# Patient Record
Sex: Female | Born: 1970 | Race: Black or African American | Hispanic: No | Marital: Single | Smoking: Never smoker
Health system: Southern US, Community
[De-identification: ages and names within clinical notes are randomized; demographics above are authoritative.]

## PROBLEM LIST (undated history)

## (undated) DIAGNOSIS — D649 Anemia, unspecified: Secondary | ICD-10-CM

## (undated) DIAGNOSIS — Z889 Allergy status to unspecified drugs, medicaments and biological substances status: Secondary | ICD-10-CM

## (undated) DIAGNOSIS — K859 Acute pancreatitis without necrosis or infection, unspecified: Secondary | ICD-10-CM

## (undated) DIAGNOSIS — E119 Type 2 diabetes mellitus without complications: Secondary | ICD-10-CM

## (undated) HISTORY — PX: EYE SURGERY: SHX253

---

## 2012-09-28 ENCOUNTER — Encounter (HOSPITAL_COMMUNITY): Payer: Self-pay | Admitting: Emergency Medicine

## 2012-09-28 ENCOUNTER — Inpatient Hospital Stay (HOSPITAL_COMMUNITY)
Admission: EM | Admit: 2012-09-28 | Discharge: 2012-10-03 | DRG: 204 | Disposition: A | Discharge status: Home / Self Care | Payer: BC Managed Care – PPO | Attending: Internal Medicine | Admitting: Internal Medicine

## 2012-09-28 DIAGNOSIS — J302 Other seasonal allergic rhinitis: Secondary | ICD-10-CM | POA: Diagnosis present

## 2012-09-28 DIAGNOSIS — Z79899 Other long term (current) drug therapy: Secondary | ICD-10-CM

## 2012-09-28 DIAGNOSIS — J309 Allergic rhinitis, unspecified: Secondary | ICD-10-CM | POA: Diagnosis present

## 2012-09-28 DIAGNOSIS — IMO0001 Reserved for inherently not codable concepts without codable children: Secondary | ICD-10-CM | POA: Diagnosis present

## 2012-09-28 DIAGNOSIS — K5900 Constipation, unspecified: Secondary | ICD-10-CM | POA: Diagnosis present

## 2012-09-28 DIAGNOSIS — Z6830 Body mass index (BMI) 30.0-30.9, adult: Secondary | ICD-10-CM

## 2012-09-28 DIAGNOSIS — E871 Hypo-osmolality and hyponatremia: Secondary | ICD-10-CM | POA: Diagnosis present

## 2012-09-28 DIAGNOSIS — IMO0002 Reserved for concepts with insufficient information to code with codable children: Secondary | ICD-10-CM | POA: Diagnosis present

## 2012-09-28 DIAGNOSIS — E669 Obesity, unspecified: Secondary | ICD-10-CM | POA: Diagnosis present

## 2012-09-28 DIAGNOSIS — R1011 Right upper quadrant pain: Secondary | ICD-10-CM

## 2012-09-28 DIAGNOSIS — K859 Acute pancreatitis without necrosis or infection, unspecified: Principal | ICD-10-CM | POA: Diagnosis present

## 2012-09-28 HISTORY — DX: Acute pancreatitis without necrosis or infection, unspecified: K85.90

## 2012-09-28 HISTORY — DX: Type 2 diabetes mellitus without complications: E11.9

## 2012-09-28 NOTE — ED Notes (Signed)
C/o abdominal pain. Was seen on Saturday in [place]'s ED and dx with acute pancreatitis. C/o same pain as before. Does have appointment with endocrinologist but states pain was too much to bear tonight.

## 2012-09-29 ENCOUNTER — Encounter (HOSPITAL_COMMUNITY): Payer: Self-pay

## 2012-09-29 ENCOUNTER — Inpatient Hospital Stay (HOSPITAL_COMMUNITY): Payer: BC Managed Care – PPO

## 2012-09-29 DIAGNOSIS — E669 Obesity, unspecified: Secondary | ICD-10-CM | POA: Diagnosis present

## 2012-09-29 DIAGNOSIS — K859 Acute pancreatitis without necrosis or infection, unspecified: Secondary | ICD-10-CM

## 2012-09-29 DIAGNOSIS — E1165 Type 2 diabetes mellitus with hyperglycemia: Secondary | ICD-10-CM | POA: Diagnosis present

## 2012-09-29 LAB — CBC
MCH: 28.3 pg (ref 26.0–34.0)
MCV: 83.4 fL (ref 78.0–100.0)
Platelets: 207 10*3/uL (ref 150–400)
RDW: 12.5 % (ref 11.5–15.5)

## 2012-09-29 LAB — TSH: TSH: 1.349 u[IU]/mL (ref 0.350–4.500)

## 2012-09-29 LAB — URINALYSIS, ROUTINE W REFLEX MICROSCOPIC
Ketones, ur: 15 mg/dL — AB
Leukocytes, UA: NEGATIVE
Nitrite: NEGATIVE
Urobilinogen, UA: 0.2 mg/dL (ref 0.0–1.0)
pH: 5.5 (ref 5.0–8.0)

## 2012-09-29 LAB — GLUCOSE, CAPILLARY
Glucose-Capillary: 164 mg/dL — ABNORMAL HIGH (ref 70–99)
Glucose-Capillary: 119 mg/dL — ABNORMAL HIGH (ref 70–99)

## 2012-09-29 LAB — CBC WITH DIFFERENTIAL/PLATELET
Eosinophils Absolute: 0 10*3/uL (ref 0.0–0.7)
Eosinophils Relative: 0 % (ref 0–5)
Hemoglobin: 12 g/dL (ref 12.0–15.0)
Lymphocytes Relative: 6 % — ABNORMAL LOW (ref 12–46)
Lymphs Abs: 0.9 10*3/uL (ref 0.7–4.0)
MCH: 28.6 pg (ref 26.0–34.0)
MCV: 83.6 fL (ref 78.0–100.0)
Monocytes Relative: 5 % (ref 3–12)
RBC: 4.2 MIL/uL (ref 3.87–5.11)

## 2012-09-29 LAB — LIPASE, BLOOD: Lipase: 597 U/L — ABNORMAL HIGH (ref 11–59)

## 2012-09-29 LAB — COMPREHENSIVE METABOLIC PANEL
Alkaline Phosphatase: 78 U/L (ref 39–117)
BUN: 7 mg/dL (ref 6–23)
CO2: 24 mEq/L (ref 19–32)
Calcium: 9.9 mg/dL (ref 8.4–10.5)
GFR calc Af Amer: >90 mL/min (ref >90)
GFR calc non Af Amer: >90 mL/min (ref >90)
Glucose, Bld: 250 mg/dL — ABNORMAL HIGH (ref 70–99)
Potassium: 4 mEq/L (ref 3.5–5.1)
Total Protein: 8.6 g/dL — ABNORMAL HIGH (ref 6.0–8.3)

## 2012-09-29 LAB — HEPATIC FUNCTION PANEL
AST: 6 U/L (ref 0–37)
Bilirubin, Direct: <0.1 mg/dL (ref 0.0–0.3)

## 2012-09-29 LAB — URINE MICROSCOPIC-ADD ON

## 2012-09-29 MED ORDER — SODIUM CHLORIDE 0.9 % IV SOLN: INTRAVENOUS | Status: DC

## 2012-09-29 MED ORDER — SODIUM CHLORIDE 0.9 % IV SOLN
Freq: Once | INTRAVENOUS | Status: AC
  Administered 2012-09-29: 02:00:00 via INTRAVENOUS

## 2012-09-29 MED ORDER — DIPHENHYDRAMINE HCL 50 MG/ML IJ SOLN
12.5000 mg | Freq: Four times a day (QID) | INTRAMUSCULAR | Status: DC | PRN
  Filled 2012-09-29: qty 1

## 2012-09-29 MED ORDER — TRAZODONE HCL 50 MG PO TABS: 25.0000 mg | ORAL_TABLET | Freq: Every evening | ORAL | Status: DC | PRN

## 2012-09-29 MED ORDER — ONDANSETRON HCL 4 MG/2ML IJ SOLN: 4.0000 mg | INTRAMUSCULAR | Status: DC | PRN

## 2012-09-29 MED ORDER — POTASSIUM CHLORIDE IN NACL 20-0.9 MEQ/L-% IV SOLN
INTRAVENOUS | Status: DC
  Administered 2012-09-29: 06:00:00 via INTRAVENOUS
  Filled 2012-09-29: qty 1000

## 2012-09-29 MED ORDER — DIPHENHYDRAMINE HCL 50 MG/ML IJ SOLN
25.0000 mg | Freq: Four times a day (QID) | INTRAMUSCULAR | Status: DC | PRN
  Administered 2012-09-29: 25 mg via INTRAVENOUS

## 2012-09-29 MED ORDER — BISACODYL 10 MG RE SUPP: 10.0000 mg | Freq: Every day | RECTAL | Status: DC | PRN

## 2012-09-29 MED ORDER — OXYCODONE HCL 5 MG PO TABS
5.0000 mg | ORAL_TABLET | ORAL | Status: DC | PRN
  Administered 2012-10-02: 5 mg via ORAL
  Filled 2012-09-29: qty 1

## 2012-09-29 MED ORDER — ENOXAPARIN SODIUM 40 MG/0.4ML ~~LOC~~ SOLN
40.0000 mg | SUBCUTANEOUS | Status: DC
  Administered 2012-09-29 – 2012-10-03 (×5): 40 mg via SUBCUTANEOUS
  Filled 2012-09-29 (×5): qty 0.4

## 2012-09-29 MED ORDER — SODIUM CHLORIDE 0.9 % IV BOLUS (SEPSIS)
1000.0000 mL | Freq: Once | INTRAVENOUS | Status: AC
  Administered 2012-09-29: 1000 mL via INTRAVENOUS

## 2012-09-29 MED ORDER — HYDROMORPHONE HCL PF 1 MG/ML IJ SOLN
0.5000 mg | INTRAMUSCULAR | Status: DC | PRN
  Administered 2012-09-29 – 2012-10-03 (×19): 1 mg via INTRAVENOUS
  Filled 2012-09-29 (×20): qty 1

## 2012-09-29 MED ORDER — PANTOPRAZOLE SODIUM 40 MG IV SOLR
40.0000 mg | INTRAVENOUS | Status: AC
  Administered 2012-09-29: 40 mg via INTRAVENOUS
  Filled 2012-09-29: qty 40

## 2012-09-29 MED ORDER — ONDANSETRON HCL 4 MG/2ML IJ SOLN
4.0000 mg | Freq: Once | INTRAMUSCULAR | Status: AC
  Administered 2012-09-29: 4 mg via INTRAVENOUS
  Filled 2012-09-29: qty 2

## 2012-09-29 MED ORDER — FLEET ENEMA 7-19 GM/118ML RE ENEM: 1.0000 | ENEMA | Freq: Once | RECTAL | Status: AC | PRN

## 2012-09-29 MED ORDER — POTASSIUM CHLORIDE IN NACL 20-0.9 MEQ/L-% IV SOLN
INTRAVENOUS | Status: DC
  Administered 2012-09-29 – 2012-10-02 (×7): via INTRAVENOUS

## 2012-09-29 MED ORDER — INSULIN ASPART 100 UNIT/ML ~~LOC~~ SOLN
0.0000 [IU] | SUBCUTANEOUS | Status: DC
  Administered 2012-09-29: 3 [IU] via SUBCUTANEOUS
  Administered 2012-09-29 – 2012-09-30 (×5): 2 [IU] via SUBCUTANEOUS

## 2012-09-29 MED ORDER — HYDROMORPHONE HCL PF 1 MG/ML IJ SOLN
1.0000 mg | Freq: Once | INTRAMUSCULAR | Status: AC
  Administered 2012-09-29: 1 mg via INTRAVENOUS
  Filled 2012-09-29: qty 1

## 2012-09-29 NOTE — ED Provider Notes (Signed)
History     CSN: [id number]  Arrival date & time 09/28/12  2235   First MD Initiated Contact with Patient 09/28/12 2304      Chief Complaint  Patient presents with  . Abdominal Pain    (Consider location/radiation/quality/duration/timing/severity/associated sxs/prior treatment) HPI Tara Kane is a 42 y.o. female , diabetic who presents to the Emergency Department complaining of abdominal pain that radiates to her back. She was seen at the the [facility] ER on Saturday and told she had a pancreatitis with an elevated lipase. She was discharged with bentyl which has not helped her pain. Today her pain was so severe she felt she could not handle it. She states she had an ultrasound which did not show an inflamed gall bladder. She has an appointment with an endocrinologist.     Past Medical History  Diagnosis Date  . Diabetes mellitus without complication     History reviewed. No pertinent past surgical history.  No family history on file.  History  Substance Use Topics  . Smoking status: Never Smoker   . Smokeless tobacco: Not on file  . Alcohol Use: No    OB History   Grav Para Term Preterm Abortions TAB SAB Ect Mult Living                  Review of Systems  Constitutional: Negative for fever.       10 Systems reviewed and are negative for acute change except as noted in the HPI.  HENT: Negative for congestion.   Eyes: Negative for discharge and redness.  Respiratory: Negative for cough and shortness of breath.   Cardiovascular: Negative for chest pain.  Gastrointestinal: Positive for nausea and abdominal pain. Negative for vomiting.  Musculoskeletal: Negative for back pain.  Skin: Negative for rash.  Neurological: Negative for syncope, numbness and headaches.  Psychiatric/Behavioral:       No behavior change.    Allergies  Aspirin and Ibuprofen  Home Medications   Current Outpatient Rx  Name  Route  Sig  Dispense  Refill  . fexofenadine (ALLEGRA)  180 MG tablet   Oral   Take 180 mg by mouth daily.         . metFORMIN (GLUCOPHAGE) 1000 MG tablet   Oral   Take 1,000 mg by mouth 2 (two) times daily with a meal.         . Multiple Vitamin (MULTIVITAMIN) tablet   Oral   Take 1 tablet by mouth daily.         . ramipril (ALTACE) 2.5 MG tablet   Oral   Take 2.5 mg by mouth daily.           BP 116/50  Pulse 86  Temp(Src) 98.1 F (36.7 C) (Oral)  Resp 20  Ht 5\' 10"  (1.778 m)  Wt 210 lb (95.255 kg)  BMI 30.13 kg/m2  SpO2 94%  LMP 09/27/2012  Physical Exam  Nursing note and vitals reviewed. Constitutional: She appears well-developed and well-nourished.  Awake, alert, nontoxic appearance.  HENT:  Head: Normocephalic and atraumatic.  Right Ear: External ear normal.  Left Ear: External ear normal.  Mouth/Throat: Oropharynx is clear and moist.  Eyes: EOM are normal. Pupils are equal, round, and reactive to light. Right eye exhibits no discharge. Left eye exhibits no discharge.  Neck: Normal range of motion. Neck supple.  Cardiovascular: Normal rate and intact distal pulses.   Pulmonary/Chest: Effort normal and breath sounds normal. She exhibits no tenderness.  Abdominal: Soft. Bowel sounds are normal. There is tenderness. There is no rebound.  RUQ tenderness to palpation. No epigastric tenderness.   Musculoskeletal: She exhibits no tenderness.  Baseline ROM, no obvious new focal weakness.  Neurological:  Mental status and motor strength appears baseline for patient and situation.  Skin: No rash noted.  Psychiatric: She has a normal mood and affect.    ED Course  Procedures (including critical care time)  Results for orders placed during the hospital encounter of 09/28/12  CBC WITH DIFFERENTIAL      Result Value Range   WBC 14.8 (*) 4.0 - 10.5 K/uL   RBC 4.20  3.87 - 5.11 MIL/uL   Hemoglobin 12.0  12.0 - 15.0 g/dL   HCT 21.3 (*) 08.6 - 57.8 %   MCV 83.6  78.0 - 100.0 fL   MCH 28.6  26.0 - 34.0 pg   MCHC  34.2  30.0 - 36.0 g/dL   RDW 46.9  62.9 - 52.8 %   Platelets 241  150 - 400 K/uL   Neutrophils Relative 89 (*) 43 - 77 %   Neutro Abs 13.1 (*) 1.7 - 7.7 K/uL   Lymphocytes Relative 6 (*) 12 - 46 %   Lymphs Abs 0.9  0.7 - 4.0 K/uL   Monocytes Relative 5  3 - 12 %   Monocytes Absolute 0.8  0.1 - 1.0 K/uL   Eosinophils Relative 0  0 - 5 %   Eosinophils Absolute 0.0  0.0 - 0.7 K/uL   Basophils Relative 0  0 - 1 %   Basophils Absolute 0.0  0.0 - 0.1 K/uL  COMPREHENSIVE METABOLIC PANEL      Result Value Range   Sodium 131 (*) 135 - 145 mEq/L   Potassium 4.0  3.5 - 5.1 mEq/L   Chloride 93 (*) 96 - 112 mEq/L   CO2 24  19 - 32 mEq/L   Glucose, Bld 250 (*) 70 - 99 mg/dL   BUN 7  6 - 23 mg/dL   Creatinine, Ser 4.13 (*) 0.50 - 1.10 mg/dL   Calcium 9.9  8.4 - 24.4 mg/dL   Total Protein 8.6 (*) 6.0 - 8.3 g/dL   Albumin 3.7  3.5 - 5.2 g/dL   AST 10  0 - 37 U/L   ALT 7  0 - 35 U/L   Alkaline Phosphatase 78  39 - 117 U/L   Total Bilirubin 0.2 (*) 0.3 - 1.2 mg/dL   GFR calc non Af Amer >90  >90 mL/min   GFR calc Af Amer >90  >90 mL/min  LIPASE, BLOOD      Result Value Range   Lipase 597 (*) 11 - 59 U/L    3:31 AM:  T/C to Dr. Orvan Falconer, hospitalist, case discussed, including:  HPI, pertinent PM/SHx, VS/PE, dx testing, ED course and treatment.  Agreeable to admission.  Requests to write temporary orders, med-surg bed to team 1.   MDM  Patient with recent visit to [facility] ER and diagnosis of pancreatitis. Her pain is RUQ rather than epigastric pain with radiation to the back. Given IVF, analgesic, PPI, zofran with some relief. Lipase is 597. WBC 14.8.  Will arrange for admission.Spoke with Dr. Orvan Falconer who will admit. Pt stable in ED with no significant deterioration in condition.The patient appears reasonably stabilized for admission considering the current resources, flow, and capabilities available in the ED at this time, and I doubt any other [facility] requiring further screening and/or treatment  in the  ED prior to admission.  MDM Reviewed: nursing note and vitals Interpretation: labs  CRITICAL CARE Performed by: Annamarie Dawley Total critical care time: 40 Critical care time was exclusive of separately billable procedures and treating other patients. Critical care was necessary to treat or prevent imminent or life-threatening deterioration. Critical care was time spent personally by me on the following activities: development of treatment plan with patient and/or surrogate as well as nursing, discussions with consultants, evaluation of patient's response to treatment, examination of patient, obtaining history from patient or surrogate, ordering and performing treatments and interventions, ordering and review of laboratory studies, ordering and review of radiographic studies, pulse oximetry and re-evaluation of patient's condition.         Nicoletta Dress. Colon Branch, MD 09/29/12 [phone]

## 2012-09-29 NOTE — H&P (Signed)
[facility] History and Physical  Tara Kane  ZOX:[id number]  DOB: Feb 03, 1971   DOA: 09/29/2012   PCP:   No primary provider on file. Endocrinologist: Dr. Talmage Nap   Chief Complaint:  Abdominal pain for 3 days  HPI: Tara Kane is an 42 y.o. female.  Obese African American lady who has progressively lost about 50 pounds on Weight Watchers diet, was seen at the [facility] emergency room 3 days ago for abdominal pain and diagnosed with acute pancreatitis. She reportedly had an ultrasound which was "normal"and was discharged home with Bentyl to follow up with her primary care physician. However because the pain is gotten progressively worse she presented to our emergency room tonight. She complains of pain which is epigastric and in the right anterior flank radiating around to the posterior flank. She has difficulty finding a comfortable position because of the pain. Since Saturday she has barely eaten or she gets nauseous at the sight of food.  She denies vomiting or diarrhea; she denies fever but  has been having chills  Rewiew of Systems:   All systems negative except as marked bold or noted in the HPI;  Constitutional:    malaise, fever Eyes:   eye pain, redness and discharge. ;  ENMT:   ear pain, hoarseness, nasal congestion, sinus pressure and sore throat. ;  Cardiovascular:    chest pain, palpitations, diaphoresis, dyspnea and peripheral edema.  Respiratory:   cough, hemoptysis, wheezing and stridor. ;  Gastrointestinal:  , vomiting, diarrhea, constipation, abdominal pain, melena, blood in stool, hematemesis, jaundice and rectal bleeding. unusual weight loss..   Genitourinary:    frequency, dysuria, incontinence,flank pain and hematuria; Musculoskeletal:    neck pain.  swelling and trauma.;  Skin: .  pruritus, rash, abrasions, bruising and skin lesion.; ulcerations Neuro:    headache, lightheadedness and neck stiffness.  weakness, altered level of consciousness, altered  mental status, extremity weakness, burning feet, involuntary movement, seizure and syncope.  Psych:    anxiety, depression, insomnia, tearfulness, panic attacks, hallucinations, paranoia, suicidal or homicidal ideation    Past Medical History  Diagnosis Date  . Diabetes mellitus without complication     History reviewed. No pertinent past surgical history.  Medications:  HOME MEDS: Prior to Admission medications   Medication Sig Start Date End Date Taking? Authorizing Provider  fexofenadine (ALLEGRA) 180 MG tablet Take 180 mg by mouth daily.   Yes Historical Provider, MD  metFORMIN (GLUCOPHAGE) 1000 MG tablet Take 1,000 mg by mouth 2 (two) times daily with a meal.   Yes Historical Provider, MD  Multiple Vitamin (MULTIVITAMIN) tablet Take 1 tablet by mouth daily.   Yes Historical Provider, MD  ramipril (ALTACE) 2.5 MG tablet Take 2.5 mg by mouth daily.   Yes Historical Provider, MD     Allergies:  Allergies  Allergen Reactions  . Aspirin   . Ibuprofen     Social History:   reports that she has never smoked. She does not have any smokeless tobacco history on file. She reports that she does not drink alcohol or use illicit drugs.  Family History: Family History  Problem Relation Age of Onset  . Diabetes Father      Physical Exam: Filed Vitals:   09/28/12 2239 09/29/12 0147 09/29/12 0403 09/29/12 0435  BP: 126/46 116/50 116/55 124/70  Pulse: 69 86 78 71  Temp: 98.1 F (36.7 C)   98.1 F (36.7 C)  TempSrc: Oral     Resp: 18 20 16  18  Height: 5\' 10"  (1.778 m)     Weight: 95.255 kg (210 lb)   96.299 kg (212 lb 4.8 oz)  SpO2: 99% 94% 97% 100%   Blood pressure 124/70, pulse 71, temperature 98.1 F (36.7 C), temperature source Oral, resp. rate 18, height 5\' 10"  (1.778 m), weight 96.299 kg (212 lb 4.8 oz), last menstrual period 09/27/2012, SpO2 100.00%.  GEN:  PleasantAmerican lady lying  bed looking uncomfortable ; cooperative with exam PSYCH:  alert and oriented x4;    neither anxious or depressed; affect is appropriate. HEENT: Mucous membranes pink, dry  and anicteric; PERRLA; EOM intact; no cervical lymphadenopathy nor thyromegaly or carotid bruit; no JVD; thick neck Breasts:: Not examined CHEST WALL: No tenderness CHEST: Normal respiration, clear to auscultation bilaterally HEART: Regular rate and rhythm; no murmurs rubs or gallops BACK: No kyphosis no scoliosis; no CVA tenderness ABDOMEN: Obese,  Diffuse tenderness maximal in the right anterior ; no masses, no organomegaly, normal abdominal bowel sounds; no intertriginous candida. Rectal Exam: Not done EXTREMITIES: No bone or joint deformity;  no edema; no ulcerations. Genitalia: not examined PULSES: 2+ and symmetric SKIN: Normal hydration no rash or ulceration CNS: Cranial nerves 2-12 grossly intact no focal lateralizing neurologic deficit   Labs on Admission:  Basic Metabolic Panel:  Recent Labs Lab 09/29/12 0125  NA 131*  K 4.0  CL 93*  CO2 24  GLUCOSE 250*  BUN 7  CREATININE 0.44*  CALCIUM 9.9   Liver Function Tests:  Recent Labs Lab 09/29/12 0125  AST 10  ALT 7  ALKPHOS 78  BILITOT 0.2*  PROT 8.6*  ALBUMIN 3.7    Recent Labs Lab 09/29/12 0125  LIPASE 597*   No results found for this basename: AMMONIA,  in the last 168 hours CBC:  Recent Labs Lab 09/29/12 0125  WBC 14.8*  NEUTROABS 13.1*  HGB 12.0  HCT 35.1*  MCV 83.6  PLT 241   Cardiac Enzymes: No results found for this basename: CKTOTAL, CKMB, CKMBINDEX, TROPONINI,  in the last 168 hours BNP: No components found with this basename: POCBNP,  D-dimer: No components found with this basename: D-DIMER,  CBG: No results found for this basename: GLUCAP,  in the last 168 hours  Radiological Exams on Admission: No results found.    Assessment/Plan Present on Admission:  . Pancreatitis, acute . Diabetes type 2, uncontrolled . Obesity, unspecified   PLAN: We'll admit this lady for intravenous fluid  hydration and pain management. Will keep her n.p.o. except for ice chips to rest her pancreas Sliding scale around-the-clock coverage for her diabetes Get ultrasound to assess gallbladder and gallstones She is already on a program to deal with obesity and is making significant progress;  her weight loss may in fact be a precipitating cause of gallstones and pancreatitis; no further adjustments at this  Other plans as per orders.  Code Status: FULL CODE  Family Communication: The patient and her mother at bedside  Disposition Plan:  home when able to tolerate a diet without pain    CAMPBELL,LEOPOLD Nocturnist [facility] Pager [phone]   09/29/2012, 5:52 AM

## 2012-09-29 NOTE — Progress Notes (Signed)
UR Chart Review Completed  

## 2012-09-29 NOTE — Progress Notes (Signed)
This patient was admitted to the hospital earlier today by Dr. Orvan Falconer  Patient seen and examined.  Database reviewed.  Patient has been admitted for acute pancreatitis. She denies any alcohol intake. Ultrasound is negative for gallstones. Will check lipid panel for hypertriglyceridemia. She will likely need outpatient followup with GI to determine further etiology of pancreatitis and may need outpatient EUS. We will start the patient on clear liquids since she is hungry and her abdominal pain has improved.

## 2012-09-30 ENCOUNTER — Encounter (HOSPITAL_COMMUNITY): Payer: Self-pay | Admitting: Internal Medicine

## 2012-09-30 DIAGNOSIS — E871 Hypo-osmolality and hyponatremia: Secondary | ICD-10-CM | POA: Diagnosis present

## 2012-09-30 LAB — LIPID PANEL
HDL: 27 mg/dL — ABNORMAL LOW (ref >39)
LDL Cholesterol: 34 mg/dL (ref 0–99)
Total CHOL/HDL Ratio: 2.6 RATIO
Triglycerides: 48 mg/dL (ref <150)
VLDL: 10 mg/dL (ref 0–40)

## 2012-09-30 LAB — CBC
MCH: 29.2 pg (ref 26.0–34.0)
Platelets: 189 10*3/uL (ref 150–400)
RBC: 3.56 MIL/uL — ABNORMAL LOW (ref 3.87–5.11)
WBC: 9.4 10*3/uL (ref 4.0–10.5)

## 2012-09-30 LAB — COMPREHENSIVE METABOLIC PANEL
ALT: 6 U/L (ref 0–35)
AST: 5 U/L (ref 0–37)
Albumin: 2.7 g/dL — ABNORMAL LOW (ref 3.5–5.2)
CO2: 23 mEq/L (ref 19–32)
Calcium: 8.9 mg/dL (ref 8.4–10.5)
Chloride: 102 mEq/L (ref 96–112)
GFR calc non Af Amer: >90 mL/min (ref >90)
Sodium: 134 mEq/L — ABNORMAL LOW (ref 135–145)

## 2012-09-30 LAB — GLUCOSE, CAPILLARY
Glucose-Capillary: 161 mg/dL — ABNORMAL HIGH (ref 70–99)
Glucose-Capillary: 197 mg/dL — ABNORMAL HIGH (ref 70–99)

## 2012-09-30 MED ORDER — FEXOFENADINE HCL 180 MG PO TABS
180.0000 mg | ORAL_TABLET | Freq: Every day | ORAL | Status: DC
  Administered 2012-10-01 – 2012-10-03 (×3): 180 mg via ORAL
  Filled 2012-09-30 (×5): qty 1

## 2012-09-30 MED ORDER — INSULIN ASPART 100 UNIT/ML ~~LOC~~ SOLN
0.0000 [IU] | Freq: Every day | SUBCUTANEOUS | Status: DC
  Administered 2012-10-02: 3 [IU] via SUBCUTANEOUS

## 2012-09-30 MED ORDER — INSULIN ASPART 100 UNIT/ML ~~LOC~~ SOLN
0.0000 [IU] | Freq: Three times a day (TID) | SUBCUTANEOUS | Status: DC
  Administered 2012-09-30: 3 [IU] via SUBCUTANEOUS
  Administered 2012-10-01: 5 [IU] via SUBCUTANEOUS
  Administered 2012-10-01 (×2): 3 [IU] via SUBCUTANEOUS
  Administered 2012-10-02: 5 [IU] via SUBCUTANEOUS
  Administered 2012-10-02: 3 [IU] via SUBCUTANEOUS
  Administered 2012-10-02 – 2012-10-03 (×4): 5 [IU] via SUBCUTANEOUS

## 2012-09-30 MED ORDER — LORATADINE 10 MG PO TABS
10.0000 mg | ORAL_TABLET | Freq: Every day | ORAL | Status: DC
  Filled 2012-09-30: qty 1

## 2012-09-30 MED ORDER — NON FORMULARY: 1.0000 | Freq: Every day | Status: DC

## 2012-09-30 NOTE — Care Management Note (Unsigned)
    Page 1 of 1   10/01/2012     12:32:54 PM   CARE MANAGEMENT NOTE 10/01/2012  Patient:  Tara Kane, Tara Kane   Account Number:  [id number]  Date Initiated:  09/30/2012  Documentation initiated by:  Rosemary Holms  Subjective/Objective Assessment:   Pt admitted from home where she lives alone. Mother is a retired Engineer, civil (consulting) who she relies on for advise.  CM discussed need for PCP which pt agrees. Sees an endicrinologist in [place] and has gone to Urgent Care centers. CM called, at pt's     Action/Plan:   request, Drs Simpson/Otterbein and asked if they would accept her. Per MD office, they are currently not accepting new pts. CM left a list of providers pt can follow up with. Pt wants to talk with her mother before pursuing PCP.   Anticipated DC Date:  10/02/2012   Anticipated DC Plan:  HOME/SELF CARE      DC Planning Services  CM consult      Choice offered to / List presented to:             Status of service:  In process, will continue to follow Medicare Important Message given?   (If response is "NO", the following Medicare IM given date fields will be blank) Date Medicare IM given:   Date Additional Medicare IM given:    Discharge Disposition:    Per UR Regulation:    If discussed at Long Length of Stay Meetings, dates discussed:    Comments:  10/01/12 Rosemary Holms RN BSN CM Pt states she has set up an appt with Tara Mires MD for 10/13/12.  09/30/12 Rosemary Holms RN BSN CM

## 2012-09-30 NOTE — Progress Notes (Signed)
Subjective: Patient is sitting up in bed. She has no complaints of abdominal pain currently. She has been tolerating all of her clear liquids except for carbonated beverages which appears to cause some abdominal discomfort. No recent vomiting. No recent bowel movement.  Objective: Vital signs in last 24 hours: Filed Vitals:   09/29/12 2157 09/30/12 0500 09/30/12 0507 09/30/12 0956  BP: 136/70  119/71 126/69  Pulse: 99  88 92  Temp: 98.9 F (37.2 C)  97.8 F (36.6 C) 97.7 F (36.5 C)  TempSrc:    Oral  Resp: 20  18 18   Height:      Weight:  96.2 kg (212 lb 1.3 oz)    SpO2: 100%  98% 97%    Intake/Output Summary (Last 24 hours) at 09/30/12 1334 Last data filed at 09/30/12 0830  Gross per 24 hour  Intake   2430 ml  Output   1450 ml  Net    980 ml    Weight change: 0.945 kg (2 lb 1.3 oz)  Physical exam: General: Pleasant alert 42 year old African-American woman sitting up in bed, in no acute distress. Lungs: Clear to auscultation bilaterally. Heart: S1, S2, with no murmurs rubs or gallops. Abdomen: Positive bowel sounds, soft, mildly tender in the epigastrium and right upper quadrant. No rebound, no guarding, no hepatosplenomegaly. No distention. Extremities: No pedal edema.  Lab Results: Basic Metabolic Panel:  Recent Labs  16/10/96 0125 09/29/12 0605 09/30/12 0614  NA 131*  --  134*  K 4.0  --  3.9  CL 93*  --  102  CO2 24  --  23  GLUCOSE 250*  --  184*  BUN 7  --  5*  CREATININE 0.44* 0.40* 0.42*  CALCIUM 9.9  --  8.9  MG  --  1.7  --    Liver Function Tests:  Recent Labs  09/29/12 0605 09/30/12 0614  AST 6 5  ALT 7 6  ALKPHOS 61 60  BILITOT 0.2* 0.2*  PROT 7.0 6.7  ALBUMIN 2.9* 2.7*    Recent Labs  09/29/12 0125 09/30/12 0614  LIPASE 597* 157*   No results found for this basename: AMMONIA,  in the last 72 hours CBC:  Recent Labs  09/29/12 0125 09/29/12 0605 09/30/12 0614  WBC 14.8* 10.3 9.4  NEUTROABS 13.1*  --   --   HGB 12.0  10.4* 10.4*  HCT 35.1* 30.7* 29.7*  MCV 83.6 83.4 83.4  PLT 241 207 189   Cardiac Enzymes: No results found for this basename: CKTOTAL, CKMB, CKMBINDEX, TROPONINI,  in the last 72 hours BNP: No results found for this basename: PROBNP,  in the last 72 hours D-Dimer: No results found for this basename: DDIMER,  in the last 72 hours CBG:  Recent Labs  09/29/12 1221 09/29/12 1642 09/29/12 2156 09/29/12 2351 09/30/12 0750 09/30/12 1154  GLUCAP 140* 119* 197* 179* 161* 193*   Hemoglobin A1C:  Recent Labs  09/29/12 0605  HGBA1C 8.1*   Fasting Lipid Panel:  Recent Labs  09/30/12 0616  CHOL 71  HDL 27*  LDLCALC 34  TRIG 48  CHOLHDL 2.6   Thyroid Function Tests:  Recent Labs  09/29/12 0605  TSH 1.349   Anemia Panel: No results found for this basename: VITAMINB12, FOLATE, FERRITIN, TIBC, IRON, RETICCTPCT,  in the last 72 hours Coagulation: No results found for this basename: LABPROT, INR,  in the last 72 hours Urine Drug Screen: Drugs of Abuse  No results found for this basename:  labopia,  cocainscrnur,  labbenz,  amphetmu,  thcu,  labbarb    Alcohol Level: No results found for this basename: ETH,  in the last 72 hours Urinalysis:  Recent Labs  09/29/12 0827  COLORURINE YELLOW  LABSPEC >1.030*  PHURINE 5.5  GLUCOSEU 250*  HGBUR NEGATIVE  BILIRUBINUR NEGATIVE  KETONESUR 15*  PROTEINUR TRACE*  UROBILINOGEN 0.2  NITRITE NEGATIVE  LEUKOCYTESUR NEGATIVE   Misc. Labs:   Micro: No results found for this or any previous visit (from the past 240 hour(s)).  Studies/Results: US Abdomen Complete  09/29/2012  *RADIOLOGY REPORT*  Abdominal ultrasound  History:  Acute pancreatitis  Gallbladder is visualized in multiple projections.  There are no gallstones, gallbladder wall thickening, or pericholecystic fluid collection.  There is no intrahepatic, common hepatic, or common bile duct dilatation.  Pancreas is obscured by gas.  No focal liver lesions are  identified.  There may be mild fatty infiltration in the liver.  Flow in the portal vein is in the anatomic direction.  Spleen is normal in size and homogeneous echotexture.  Kidneys bilaterally appear normal.  There is trace ascites adjacent to the spleen.  Aorta is nonaneurysmal.  Inferior vena cava appears normal.  Conclusion:  Pancreas cannot be visualized due to overlying gas. Given this circumstance, the degree of potential acute pancreatitis cannot be assessed with ultrasound.  There may be mild fatty change in the liver.  No focal liver lesions are identified.  Trace ascites.  Study otherwise unremarkable.   Original Report Authenticated By: Bretta Bang, M.D.     Medications:  Scheduled: . enoxaparin (LOVENOX) injection  40 mg Subcutaneous Q24H  . [START ON 10/01/2012] fexofenadine  180 mg Oral Daily  . insulin aspart  0-15 Units Subcutaneous TID WC  . insulin aspart  0-5 Units Subcutaneous QHS   Continuous: . 0.9 % NaCl with KCl 20 mEq / L 150 mL/hr at 09/30/12 4782   NFA:OZHYQMVHQ, diphenhydrAMINE, diphenhydrAMINE, HYDROmorphone (DILAUDID) injection, ondansetron (ZOFRAN) IV, oxyCODONE, traZODone  Assessment: Principal Problem:   Pancreatitis, acute Active Problems:   Obesity, unspecified   Diabetes type 2, uncontrolled   Seasonal allergies   Hyponatremia   1. Acute pancreatitis. Etiology uncertain. Ultrasound of the abdomen reveals no gallstones. She does not drink alcohol. The pancreatitis could be idiopathic or possibly secondary to ramipril since pancreatitis is listed as one of the potential side effects. Her lipase is decreasing and she is improving clinically, but has not completely pain-free. Next  Hyponatremia. Resolving with IV fluid hydration.  Type 2 diabetes mellitus. Metformin is on hold. She is being treated with sliding scale NovoLog.  Seasonal allergies. Allegra has been restarted per request.  Dilutional anemia. We'll continue to monitor.  Plan:  1.  We'll decrease the IV fluids some. We'll advance diet to full liquids. 2. Will add each bedtime sliding scale NovoLog to NovoLog during the day. 3. We'll continue to hold ramipril. 4. Order lipase level in the morning.    LOS: 2 days   FISHER,DENISE 09/30/2012, 1:34 PM  She

## 2012-10-01 DIAGNOSIS — K5900 Constipation, unspecified: Secondary | ICD-10-CM | POA: Diagnosis present

## 2012-10-01 LAB — GLUCOSE, CAPILLARY
Glucose-Capillary: 161 mg/dL — ABNORMAL HIGH (ref 70–99)
Glucose-Capillary: 189 mg/dL — ABNORMAL HIGH (ref 70–99)

## 2012-10-01 LAB — BASIC METABOLIC PANEL
GFR calc Af Amer: >90 mL/min (ref >90)
GFR calc non Af Amer: >90 mL/min (ref >90)
Potassium: 3.7 mEq/L (ref 3.5–5.1)
Sodium: 135 mEq/L (ref 135–145)

## 2012-10-01 LAB — LIPASE, BLOOD: Lipase: 169 U/L — ABNORMAL HIGH (ref 11–59)

## 2012-10-01 MED ORDER — BISACODYL 10 MG RE SUPP
10.0000 mg | Freq: Once | RECTAL | Status: AC
  Administered 2012-10-01: 10 mg via RECTAL
  Filled 2012-10-01: qty 1

## 2012-10-01 NOTE — Progress Notes (Signed)
UR Chart Review Completed  

## 2012-10-01 NOTE — Progress Notes (Signed)
Subjective: Patient is sitting up in bed. She complains of mostly lower abdominal pain and says that she has not had a bowel movement in more than one week. Epigastric pain is still present, but overall less. No complaints of nausea vomiting.  Objective: Vital signs in last 24 hours: Filed Vitals:   09/30/12 1754 09/30/12 2100 10/01/12 0200 10/01/12 0500  BP: 132/84 110/73 132/84 138/84  Pulse: 81 84 75 79  Temp: 97.6 F (36.4 C) 98.6 F (37 C) 97.9 F (36.6 C) 97.5 F (36.4 C)  TempSrc:  Oral Oral Oral  Resp: 18 16 16 20   Height:      Weight:      SpO2: 99% 96% 93% 94%    Intake/Output Summary (Last 24 hours) at 10/01/12 1013 Last data filed at 10/01/12 0900  Gross per 24 hour  Intake    360 ml  Output      0 ml  Net    360 ml    Weight change:   Physical exam: General: Pleasant alert 42 year old African-American woman sitting up in bed, in no acute distress. Lungs: Clear to auscultation bilaterally. Heart: S1, S2, with no murmurs rubs or gallops. Abdomen: Positive bowel sounds, soft, mildly tender in the epigastrium and lower quadrants without distention, rebound, or guarding.  Extremities: No pedal edema.  Lab Results: Basic Metabolic Panel:  Recent Labs  16/10/96 0125 09/29/12 0605 09/30/12 0614 10/01/12 0554  NA 131*  --  134* 135  K 4.0  --  3.9 3.7  CL 93*  --  102 102  CO2 24  --  23 25  GLUCOSE 250*  --  184* 176*  BUN 7  --  5* 5*  CREATININE 0.44* 0.40* 0.42* 0.42*  CALCIUM 9.9  --  8.9 8.9  MG  --  1.7  --   --    Liver Function Tests:  Recent Labs  09/29/12 0605 09/30/12 0614  AST 6 5  ALT 7 6  ALKPHOS 61 60  BILITOT 0.2* 0.2*  PROT 7.0 6.7  ALBUMIN 2.9* 2.7*    Recent Labs  09/30/12 0614 10/01/12 0554  LIPASE 157* 169*   No results found for this basename: AMMONIA,  in the last 72 hours CBC:  Recent Labs  09/29/12 0125 09/29/12 0605 09/30/12 0614  WBC 14.8* 10.3 9.4  NEUTROABS 13.1*  --   --   HGB 12.0 10.4* 10.4*   HCT 35.1* 30.7* 29.7*  MCV 83.6 83.4 83.4  PLT 241 207 189   Cardiac Enzymes: No results found for this basename: CKTOTAL, CKMB, CKMBINDEX, TROPONINI,  in the last 72 hours BNP: No results found for this basename: PROBNP,  in the last 72 hours D-Dimer: No results found for this basename: DDIMER,  in the last 72 hours CBG:  Recent Labs  09/29/12 2351 09/30/12 0750 09/30/12 1154 09/30/12 1700 09/30/12 2055 10/01/12 0742  GLUCAP 179* 161* 193* 197* 168* 161*   Hemoglobin A1C:  Recent Labs  09/29/12 0605  HGBA1C 8.1*   Fasting Lipid Panel:  Recent Labs  09/30/12 0616  CHOL 71  HDL 27*  LDLCALC 34  TRIG 48  CHOLHDL 2.6   Thyroid Function Tests:  Recent Labs  09/29/12 0605  TSH 1.349   Anemia Panel: No results found for this basename: VITAMINB12, FOLATE, FERRITIN, TIBC, IRON, RETICCTPCT,  in the last 72 hours Coagulation: No results found for this basename: LABPROT, INR,  in the last 72 hours Urine Drug Screen: Drugs of Abuse  No results found for this basename: labopia,  cocainscrnur,  labbenz,  amphetmu,  thcu,  labbarb    Alcohol Level: No results found for this basename: ETH,  in the last 72 hours Urinalysis:  Recent Labs  09/29/12 0827  COLORURINE YELLOW  LABSPEC >1.030*  PHURINE 5.5  GLUCOSEU 250*  HGBUR NEGATIVE  BILIRUBINUR NEGATIVE  KETONESUR 15*  PROTEINUR TRACE*  UROBILINOGEN 0.2  NITRITE NEGATIVE  LEUKOCYTESUR NEGATIVE   Misc. Labs:   Micro: No results found for this or any previous visit (from the past 240 hour(s)).  Studies/Results: US Abdomen Complete  09/29/2012  *RADIOLOGY REPORT*  Abdominal ultrasound  History:  Acute pancreatitis  Gallbladder is visualized in multiple projections.  There are no gallstones, gallbladder wall thickening, or pericholecystic fluid collection.  There is no intrahepatic, common hepatic, or common bile duct dilatation.  Pancreas is obscured by gas.  No focal liver lesions are identified.  There  may be mild fatty infiltration in the liver.  Flow in the portal vein is in the anatomic direction.  Spleen is normal in size and homogeneous echotexture.  Kidneys bilaterally appear normal.  There is trace ascites adjacent to the spleen.  Aorta is nonaneurysmal.  Inferior vena cava appears normal.  Conclusion:  Pancreas cannot be visualized due to overlying gas. Given this circumstance, the degree of potential acute pancreatitis cannot be assessed with ultrasound.  There may be mild fatty change in the liver.  No focal liver lesions are identified.  Trace ascites.  Study otherwise unremarkable.   Original Report Authenticated By: Bretta Bang, M.D.     Medications:  Scheduled: . enoxaparin (LOVENOX) injection  40 mg Subcutaneous Q24H  . fexofenadine  180 mg Oral Daily  . insulin aspart  0-15 Units Subcutaneous TID WC  . insulin aspart  0-5 Units Subcutaneous QHS   Continuous: . 0.9 % NaCl with KCl 20 mEq / L 75 mL/hr at 10/01/12 1610   RUE:[id number], diphenhydrAMINE, diphenhydrAMINE, HYDROmorphone (DILAUDID) injection, ondansetron (ZOFRAN) IV, oxyCODONE, traZODone  Assessment: Principal Problem:   Pancreatitis, acute Active Problems:   Obesity, unspecified   Diabetes type 2, uncontrolled   Seasonal allergies   Hyponatremia   Constipation   1. Acute pancreatitis. Etiology uncertain. Ultrasound of the abdomen reveals no gallstones. She does not drink alcohol. Her triglyceride level is within normal limits. The pancreatitis could be idiopathic or possibly secondary to ramipril since pancreatitis is listed as one of the potential side effects. Her lipase increased today and she does have more abdominal pain, but the pain may be associated with constipation. I favor downgrading her diet to clear liquids.   Hyponatremia. Resolved with IV fluid hydration.  Type 2 diabetes mellitus. Metformin is on hold. She is being treated with sliding scale NovoLog. Her hemoglobin A1c is 8.1. Her TSH is  within normal limits.  Seasonal allergies. Allegra has been restarted per request.  Dilutional anemia. We'll continue to monitor.  Plan:  1. Change diet back to clear liquids. 2. Dulcolax suppository today. 3. Continue supportive treatment. Check laboratory studies in the morning. 4. Encourage ambulation and an increase in activity.    LOS: 3 days   FISHER,DENISE 10/01/2012, 10:13 AM  She

## 2012-10-02 LAB — BASIC METABOLIC PANEL
BUN: 3 mg/dL — ABNORMAL LOW (ref 6–23)
Chloride: 100 mEq/L (ref 96–112)
GFR calc Af Amer: >90 mL/min (ref >90)
Potassium: 3.9 mEq/L (ref 3.5–5.1)

## 2012-10-02 LAB — GLUCOSE, CAPILLARY: Glucose-Capillary: 182 mg/dL — ABNORMAL HIGH (ref 70–99)

## 2012-10-02 MED ORDER — POLYETHYLENE GLYCOL 3350 17 G PO PACK
17.0000 g | PACK | Freq: Every day | ORAL | Status: DC
  Administered 2012-10-02 – 2012-10-03 (×2): 17 g via ORAL
  Filled 2012-10-02 (×2): qty 1

## 2012-10-02 MED ORDER — INSULIN GLARGINE 100 UNIT/ML ~~LOC~~ SOLN: 15.0000 [IU] | Freq: Every day | SUBCUTANEOUS | Status: DC

## 2012-10-02 NOTE — Progress Notes (Signed)
Subjective: Patient is sitting up in bed. She had a bowel movement yesterday. Overall, she feels better and has less abdominal pain. She wants to try full liquids again.  Objective: Vital signs in last 24 hours: Filed Vitals:   10/02/12 0259 10/02/12 0500 10/02/12 1023 10/02/12 1230  BP: 125/65 132/73 111/67 134/72  Pulse: 83  75 74  Temp: 97.9 F (36.6 C) 97.9 F (36.6 C) 97.3 F (36.3 C) 97.4 F (36.3 C)  TempSrc: Oral Oral    Resp: 16 18 18 18   Height:      Weight:      SpO2: 98% 100% 100% 100%    Intake/Output Summary (Last 24 hours) at 10/02/12 1438 Last data filed at 10/02/12 0500  Gross per 24 hour  Intake   4210 ml  Output      0 ml  Net   4210 ml    Weight change:   Physical exam: General: Pleasant alert 42 year old African-American woman sitting up in bed, in no acute distress. Lungs: Clear to auscultation bilaterally. Heart: S1, S2, with no murmurs rubs or gallops. Abdomen: Positive bowel sounds, soft, mildly tender in the epigastrium and without distention, rebound, or guarding.  Extremities: No pedal edema.  Lab Results: Basic Metabolic Panel:  Recent Labs  16/10/96 0554 10/02/12 0518  NA 135 134*  K 3.7 3.9  CL 102 100  CO2 25 25  GLUCOSE 176* 205*  BUN 5* 3*  CREATININE 0.42* 0.45*  CALCIUM 8.9 9.0   Liver Function Tests:  Recent Labs  09/30/12 0614  AST 5  ALT 6  ALKPHOS 60  BILITOT 0.2*  PROT 6.7  ALBUMIN 2.7*    Recent Labs  10/01/12 0554 10/02/12 0518  LIPASE 169* 135*   No results found for this basename: AMMONIA,  in the last 72 hours CBC:  Recent Labs  09/30/12 0614  WBC 9.4  HGB 10.4*  HCT 29.7*  MCV 83.4  PLT 189   Cardiac Enzymes: No results found for this basename: CKTOTAL, CKMB, CKMBINDEX, TROPONINI,  in the last 72 hours BNP: No results found for this basename: PROBNP,  in the last 72 hours D-Dimer: No results found for this basename: DDIMER,  in the last 72 hours CBG:  Recent Labs   10/01/12 0742 10/01/12 1155 10/01/12 1620 10/01/12 2115 10/02/12 0738 10/02/12 1202  GLUCAP 161* 180* 211* 189* 182* 222*   Hemoglobin A1C: No results found for this basename: HGBA1C,  in the last 72 hours Fasting Lipid Panel:  Recent Labs  09/30/12 0616  CHOL 71  HDL 27*  LDLCALC 34  TRIG 48  CHOLHDL 2.6   Thyroid Function Tests: No results found for this basename: TSH, T4TOTAL, FREET4, T3FREE, THYROIDAB,  in the last 72 hours Anemia Panel: No results found for this basename: VITAMINB12, FOLATE, FERRITIN, TIBC, IRON, RETICCTPCT,  in the last 72 hours Coagulation: No results found for this basename: LABPROT, INR,  in the last 72 hours Urine Drug Screen: Drugs of Abuse  No results found for this basename: labopia,  cocainscrnur,  labbenz,  amphetmu,  thcu,  labbarb    Alcohol Level: No results found for this basename: ETH,  in the last 72 hours Urinalysis: No results found for this basename: COLORURINE, APPERANCEUR, LABSPEC, PHURINE, GLUCOSEU, HGBUR, BILIRUBINUR, KETONESUR, PROTEINUR, UROBILINOGEN, NITRITE, LEUKOCYTESUR,  in the last 72 hours Misc. Labs:   Micro: No results found for this or any previous visit (from the past 240 hour(s)).  Studies/Results: No results found.  Medications:  Scheduled: . enoxaparin (LOVENOX) injection  40 mg Subcutaneous Q24H  . fexofenadine  180 mg Oral Daily  . insulin aspart  0-15 Units Subcutaneous TID WC  . insulin aspart  0-5 Units Subcutaneous QHS  . polyethylene glycol  17 g Oral Daily   Continuous: . 0.9 % NaCl with KCl 20 mEq / L 75 mL/hr at 10/02/12 1254   WUJ:[id number], diphenhydrAMINE, diphenhydrAMINE, HYDROmorphone (DILAUDID) injection, ondansetron (ZOFRAN) IV, oxyCODONE, traZODone  Assessment: Principal Problem:   Pancreatitis, acute Active Problems:   Obesity, unspecified   Diabetes type 2, uncontrolled   Seasonal allergies   Hyponatremia   Constipation   1. Acute pancreatitis. Etiology uncertain, but  could be any opacity or secondary to ramipril since pancreatitis is listed as one of the side effects. It has been discontinued. Ultrasound of the abdomen reveals no gallstones. She does not drink alcohol. Her triglyceride level is within normal limits. Her lipase is decreasing. We'll restart full liquids again.   Hyponatremia. Mild. Continue gentle IV fluids.  Type 2 diabetes mellitus. Metformin is on hold. She is being treated with sliding scale NovoLog. Her hemoglobin A1c is 8.1. Her TSH is within normal limits. We'll add Lantus per the recommendation of the diabetes coordinator.  Seasonal allergies. Allegra has been restarted per request.  Dilutional anemia. We'll continue to monitor.  Constipation. Resolved. Continue MiraLax.  Plan:  1. Change diet back to full liquids. 2. Consider discharge tomorrow if she remains symptomatically improved on full liquids. Check another lipase level in the morning. 3. Upon discharge, would decrease home metformin dose to 500 mg twice a day and restart daily basal Lantus at home. 4. Would not restart ramipril. Her blood pressure has been reasonably controlled.    LOS: 4 days   Grant Henkes 10/02/2012, 2:38 PM  She

## 2012-10-02 NOTE — Progress Notes (Signed)
Inpatient Diabetes Program Recommendations  AACE/ADA: New Consensus Statement on Inpatient Glycemic Control  Target Ranges:  Prepandial:   less than 140 mg/dL      Peak postprandial:   less than 180 mg/dL (1-2 hours)      Critically ill patients:  140 - 180 mg/dL  Pager:  [phone] Hours:  8 am-10pm   Reason for Visit: Elevated fasting glucose: 205 mg/dL  Inpatient Diabetes Program Recommendations Insulin - Basal: Add basal insulin 15 units daily  Alfredia Client PhD, RN, BC-ADM Diabetes Coordinator  Office:  [phone] Team Pager:  [phone]

## 2012-10-03 LAB — GLUCOSE, CAPILLARY
Glucose-Capillary: 208 mg/dL — ABNORMAL HIGH (ref 70–99)
Glucose-Capillary: 247 mg/dL — ABNORMAL HIGH (ref 70–99)

## 2012-10-03 LAB — CBC
HCT: 33.1 % — ABNORMAL LOW (ref 36.0–46.0)
Hemoglobin: 11.4 g/dL — ABNORMAL LOW (ref 12.0–15.0)
RBC: 4 MIL/uL (ref 3.87–5.11)
WBC: 7.1 10*3/uL (ref 4.0–10.5)

## 2012-10-03 LAB — LIPASE, BLOOD: Lipase: 104 U/L — ABNORMAL HIGH (ref 11–59)

## 2012-10-03 MED ORDER — HYDROCODONE-ACETAMINOPHEN 5-325 MG PO TABS: 1.0000 | ORAL_TABLET | Freq: Four times a day (QID) | ORAL | Status: DC | PRN

## 2012-10-03 MED ORDER — INSULIN GLARGINE 100 UNIT/ML ~~LOC~~ SOLN: 15.0000 [IU] | Freq: Every day | SUBCUTANEOUS | Status: DC

## 2012-10-03 NOTE — Discharge Summary (Signed)
Physician Discharge Summary  Tara Kane ZOX:[id number] DOB: Dec 15, 1970 DOA: 09/28/2012  PCP: Evlyn Courier, MD  Admit date: 09/28/2012 Discharge date: 10/03/2012  Time spent: 45 minutes  Recommendations for Outpatient Follow-up:  1. Followup primary care physician in one to 2 weeks 2. Followup gastroenterology in the next one month.  Discharge Diagnoses:  Principal Problem:   Pancreatitis, acute Active Problems:   Obesity, unspecified   Diabetes type 2, uncontrolled   Seasonal allergies   Hyponatremia   Constipation   Discharge Condition: Improved  Diet recommendation: Low-fat, low-salt  Filed Weights   09/29/12 0435 09/30/12 0500 10/03/12 0500  Weight: 96.299 kg (212 lb 4.8 oz) 96.2 kg (212 lb 1.3 oz) 96.1 kg (211 lb 13.8 oz)    History of present illness:  Tara Kane is an 42 y.o. female. Obese African American lady who has progressively lost about 50 pounds on Weight Watchers diet, was seen at the [facility] emergency room 3 days ago for abdominal pain and diagnosed with acute pancreatitis. She reportedly had an ultrasound which was "normal"and was discharged home with Bentyl to follow up with her primary care physician. However because the pain is gotten progressively worse she presented to our emergency room tonight. She complains of pain which is epigastric and in the right anterior flank radiating around to the posterior flank. She has difficulty finding a comfortable position because of the pain. Since Saturday she has barely eaten or she gets nauseous at the sight of food.  She denies vomiting or diarrhea; she denies fever but has been having chills   Hospital Course:  This lady was admitted to the hospital for acute pancreatitis. Etiology was not entirely clear. She does not drink alcohol and ultrasound of the abdomen did not indicate any gallstones. It was felt that this may be a side effect from ramipril, and so this was discontinued. Her triglyceride  levels were also within normal limits. With conservative management, her lipase began to trend down and her symptoms improved. She was graduated from n.p.o. status to a low-fat diet and has been tolerating this without any difficulties. It is recommended that she followup with gastroenterology in the outpatient setting to further investigate pancreatitis if needed.  Patient's blood sugars continued to trend high. She is continued on metformin and started on Lantus for better blood sugar control. We will defer further titration of her diabetic regimen to her primary care physician.  Procedures:  None  Consultations:  None  Discharge Exam: Filed Vitals:   10/03/12 0323 10/03/12 0500 10/03/12 0641 10/03/12 1435  BP: 121/79  130/84 126/83  Pulse: 80  75 83  Temp: 97.8 F (36.6 C)  97.9 F (36.6 C) 97.5 F (36.4 C)  TempSrc: Oral  Oral   Resp: 20  20 20   Height:      Weight:  96.1 kg (211 lb 13.8 oz)    SpO2: 96%  98% 100%    General: No acute distress Cardiovascular: S1, S2, regular rate and rhythm Respiratory: Clear to auscultation bilaterally  Discharge Instructions  Discharge Orders   Future Orders Complete By Expires     Call MD for:  persistant nausea and vomiting  As directed     Call MD for:  severe uncontrolled pain  As directed     Call MD for:  temperature >100.4  As directed     Diet - low sodium heart healthy  As directed     Increase activity slowly  As directed  Medication List    STOP taking these medications       ramipril 2.5 MG tablet  Commonly known as:  ALTACE      TAKE these medications       fexofenadine 180 MG tablet  Commonly known as:  ALLEGRA  Take 180 mg by mouth daily.     HYDROcodone-acetaminophen 5-325 MG per tablet  Commonly known as:  NORCO  Take 1 tablet by mouth every 6 (six) hours as needed for pain.     insulin glargine 100 UNIT/ML injection  Commonly known as:  LANTUS  Inject 15 Units into the skin at bedtime.      metFORMIN 1000 MG tablet  Commonly known as:  GLUCOPHAGE  Take 1,000 mg by mouth 2 (two) times daily with a meal.     multivitamin tablet  Take 1 tablet by mouth daily.           Follow-up Information   Follow up with [facility], MD. Schedule an appointment as soon as possible for a visit in 2 weeks.   Contact information:   [address] [place] [address] [phone]       Follow up with Eula Listen, MD. (gastroenterology, call for next available appointment)    Contact information:   [address] [place] [address] [phone]        The results of significant diagnostics from this hospitalization (including imaging, microbiology, ancillary and laboratory) are listed below for reference.    Significant Diagnostic Studies: US Abdomen Complete  09/29/2012  *RADIOLOGY REPORT*  Abdominal ultrasound  History:  Acute pancreatitis  Gallbladder is visualized in multiple projections.  There are no gallstones, gallbladder wall thickening, or pericholecystic fluid collection.  There is no intrahepatic, common hepatic, or common bile duct dilatation.  Pancreas is obscured by gas.  No focal liver lesions are identified.  There may be mild fatty infiltration in the liver.  Flow in the portal vein is in the anatomic direction.  Spleen is normal in size and homogeneous echotexture.  Kidneys bilaterally appear normal.  There is trace ascites adjacent to the spleen.  Aorta is nonaneurysmal.  Inferior vena cava appears normal.  Conclusion:  Pancreas cannot be visualized due to overlying gas. Given this circumstance, the degree of potential acute pancreatitis cannot be assessed with ultrasound.  There may be mild fatty change in the liver.  No focal liver lesions are identified.  Trace ascites.  Study otherwise unremarkable.   Original Report Authenticated By: Bretta Bang, M.D.     Microbiology: No results found for this or any  previous visit (from the past 240 hour(s)).   Labs: Basic Metabolic Panel:  Recent Labs Lab 09/29/12 0125 09/29/12 0605 09/30/12 0614 10/01/12 0554 10/02/12 0518  NA 131*  --  134* 135 134*  K 4.0  --  3.9 3.7 3.9  CL 93*  --  102 102 100  CO2 24  --  23 25 25   GLUCOSE 250*  --  184* 176* 205*  BUN 7  --  5* 5* 3*  CREATININE 0.44* 0.40* 0.42* 0.42* 0.45*  CALCIUM 9.9  --  8.9 8.9 9.0  MG  --  1.7  --   --   --    Liver Function Tests:  Recent Labs Lab 09/29/12 0125 09/29/12 0605 09/30/12 0614  AST 10 6 5   ALT 7 7 6   ALKPHOS 78 61 60  BILITOT 0.2* 0.2*  0.2*  PROT 8.6* 7.0 6.7  ALBUMIN 3.7 2.9* 2.7*    Recent Labs Lab 09/29/12 0125 09/30/12 0614 10/01/12 0554 10/02/12 0518 10/03/12 0552  LIPASE 597* 157* 169* 135* 104*   No results found for this basename: AMMONIA,  in the last 168 hours CBC:  Recent Labs Lab 09/29/12 0125 09/29/12 0605 09/30/12 0614 10/03/12 0552  WBC 14.8* 10.3 9.4 7.1  NEUTROABS 13.1*  --   --   --   HGB 12.0 10.4* 10.4* 11.4*  HCT 35.1* 30.7* 29.7* 33.1*  MCV 83.6 83.4 83.4 82.8  PLT 241 207 189 276   Cardiac Enzymes: No results found for this basename: CKTOTAL, CKMB, CKMBINDEX, TROPONINI,  in the last 168 hours BNP: BNP (last 3 results) No results found for this basename: PROBNP,  in the last 8760 hours CBG:  Recent Labs Lab 10/02/12 1625 10/02/12 2208 10/03/12 0735 10/03/12 1202 10/03/12 1700  GLUCAP 213* 270* 208* 247* 236*       Signed:  Kaleigha Chamberlin  [facility] 10/03/2012, 7:45 PM

## 2012-10-03 NOTE — Progress Notes (Signed)
Patient with orders to be discharge home. Discharge instructions given, education given on Lantus injections, patient verbalized understanding via teach back method. Patient in stable condition upon discharge. Patient left with family in private vehicle.

## 2012-10-06 ENCOUNTER — Encounter: Payer: Self-pay | Admitting: Gastroenterology

## 2012-10-06 ENCOUNTER — Ambulatory Visit (INDEPENDENT_AMBULATORY_CARE_PROVIDER_SITE_OTHER): Payer: BC Managed Care – PPO | Admitting: Gastroenterology

## 2012-10-06 VITALS — BP 108/79 | HR 108 | Temp 98.2°F | Ht 70.0 in | Wt 208.6 lb

## 2012-10-06 DIAGNOSIS — K859 Acute pancreatitis without necrosis or infection, unspecified: Secondary | ICD-10-CM

## 2012-10-06 NOTE — Assessment & Plan Note (Signed)
42 year old female who was recently discharged from [facility] after initial bout of acute pancreatitis. This improved with supportive measures and was thought to be related to Altace. LFTs and [place] of abdomen were normal. Lipid panel unimpressive. She does not smoke or drink ETOH. No CT was performed during admission. 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  I doubt she needs further investigation, such as EUS, but I will discuss this with Dr. Jena Gauss. Her initial uncomplicated episode could very well have been secondary to med effect, as she has no other risk factors. Doubt occult malignancy.   Further recommendations regarding EUS in near future Low-fat diet handout Screening colonoscopy at age 57, pt aware Return prn.

## 2012-10-06 NOTE — Progress Notes (Signed)
Primary Care Physician:  Evlyn Courier, MD Primary Gastroenterologist:  Dr. Jena Gauss   Chief Complaint  Patient presents with  . Pancreatitis    HPI:   Tara Kane is a pleasant 42 year old female who was recently inpatient at [facility] secondary to acute pancreatitis. LFTs were normal at this time, lipid panel unimpressive. [place] of abdomen without gallstones. It was thought that ramipril may have been the culprit of pancreatitis. She had been on this at least 12 years. She presents today for further evaluation at the request of the hospitalists. Denies ETOH use.  Notes she is improving. 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 Has questions on how to avoid another recurrence. Denies any nausea, vomiting. Tolerating diet.   Past Medical History  Diagnosis Date  . Diabetes mellitus without complication   . Pancreatitis, acute 09/29/2012    Past Surgical History  Procedure Laterality Date  . None      Current Outpatient Prescriptions  Medication Sig Dispense Refill  . fexofenadine (ALLEGRA) 180 MG tablet Take 180 mg by mouth daily.      . insulin glargine (LANTUS) 100 UNIT/ML injection Inject 15 Units into the skin at bedtime.  10 mL  1  . metFORMIN (GLUCOPHAGE) 1000 MG tablet Take 1,000 mg by mouth 2 (two) times daily with a meal.      . Multiple Vitamin (MULTIVITAMIN) tablet Take 1 tablet by mouth daily.       No current facility-administered medications for this visit.    Allergies as of 10/06/2012 - Review Complete 10/06/2012  Allergen Reaction Noted  . Aspirin Anaphylaxis and Swelling 09/28/2012  . Ibuprofen Anaphylaxis and Swelling 09/28/2012  . Latex Swelling 09/29/2012  . Other Swelling 09/29/2012    Family History  Problem Relation Age of Onset  . Diabetes Father   . Polymyalgia rheumatica Mother   . Sarcoidosis Mother   . Colon cancer Neg Hx     History   Social History  . Marital Status: Single    Spouse Name: N/A    Number of Children: N/A  . Years of  Education: N/A   Occupational History  . teacher     [employer]   Social History Main Topics  . Smoking status: Never Smoker   . Smokeless tobacco: Not on file  . Alcohol Use: No  . Drug Use: No  . Sexually Active: No   Other Topics Concern  . Not on file   Social History Narrative  . No narrative on file    Review of Systems: Gen: Denies any fever, chills, fatigue, weight loss, lack of appetite.  CV: Denies chest pain, heart palpitations, peripheral edema, syncope.  Resp: Denies shortness of breath at rest or with exertion. Denies wheezing or cough.  GI: SEE HPI GU : Denies urinary burning, urinary frequency, urinary hesitancy MS: Denies joint pain, muscle weakness, cramps, or limitation of movement.  Derm: Denies rash, itching, dry skin Psych: Denies depression, anxiety, memory loss, and confusion Heme: Denies bruising, bleeding, and enlarged lymph nodes.  Physical Exam: BP 108/79  Pulse 108  Temp(Src) 98.2 F (36.8 C) (Oral)  Ht 5\' 10"  (1.778 m)  Wt 208 lb 9.6 oz (94.62 kg)  BMI 29.93 kg/m2  LMP 09/27/2012 General:   Alert and oriented. Pleasant and cooperative. Well-nourished and well-developed.  Head:  Normocephalic and atraumatic. Eyes:  Without icterus, sclera clear and conjunctiva pink.  Ears:  Normal auditory acuity. Nose:  No deformity, discharge,  or lesions. Mouth:  No  deformity or lesions, oral mucosa pink.  Neck:  Supple, without mass or thyromegaly. Lungs:  Clear to auscultation bilaterally. No wheezes, rales, or rhonchi. No distress.  Heart:  S1, S2 present without murmurs appreciated.  Abdomen:  +BS, soft, non-tender and non-distended. No HSM noted. No guarding or rebound. No masses appreciated.  Rectal:  Deferred  Msk:  Symmetrical without gross deformities. Normal posture. Pulses:  Normal pulses noted. Extremities:  Without clubbing or edema. Neurologic:  Alert and  oriented x4;  grossly normal neurologically. Skin:  Intact  without significant lesions or rashes. Cervical Nodes:  No significant cervical adenopathy. Psych:  Alert and cooperative. Normal mood and affect.

## 2012-10-06 NOTE — Patient Instructions (Addendum)
Review the low-fat diet handout and pancreatitis handout.  I will be discussing with Dr. Jena Gauss if you need any further testing. I or one of the nurses will call you back with the final recommendations.  Please call us if you need anything. You will be due for an initial screening colonoscopy at age 42.    Fat and Cholesterol Control Diet Cholesterol levels in your body are determined significantly by your diet. Cholesterol levels may also be related to heart disease. The following material helps to explain this relationship and discusses what you can do to help keep your heart healthy. Not all cholesterol is bad. Low-density lipoprotein (LDL) cholesterol is the "bad" cholesterol. It may cause fatty deposits to build up inside your arteries. High-density lipoprotein (HDL) cholesterol is "good." It helps to remove the "bad" LDL cholesterol from your blood. Cholesterol is a very important risk factor for heart disease. Other risk factors are high blood pressure, smoking, stress, heredity, and weight. The heart muscle gets its supply of blood through the coronary arteries. If your LDL cholesterol is high and your HDL cholesterol is low, you are at risk for having fatty deposits build up in your coronary arteries. This leaves less room through which blood can flow. Without sufficient blood and oxygen, the heart muscle cannot function properly and you may feel chest pains (angina pectoris). When a coronary artery closes up entirely, a part of the heart muscle may die causing a heart attack (myocardial infarction). CHECKING CHOLESTEROL When your caregiver sends your blood to a lab to be examined for cholesterol, a complete lipid (fat) profile may be done. With this test, the total amount of cholesterol and levels of LDL and HDL are determined. Triglycerides are a type of fat that circulates in the blood. They can also be used to determine heart disease risk. The list below describes what the numbers should  be: Test: Total Cholesterol.  Less than 200 mg/dl. Test: LDL "bad cholesterol."  Less than 100 mg/dl.  Less than 70 mg/dl if you are at very high risk of a heart attack or sudden cardiac death. Test: HDL "good cholesterol."  Greater than 50 mg/dl for women.  Greater than 40 mg/dl for men. Test: Triglycerides.  Less than 150 mg/dl. CONTROLLING CHOLESTEROL WITH DIET Although exercise and lifestyle factors are important, your diet is key. That is because certain foods are known to raise cholesterol and others to lower it. The goal is to balance foods for their effect on cholesterol and more importantly, to replace saturated and trans fat with other types of fat, such as monounsaturated fat, polyunsaturated fat, and omega-3 fatty acids. On average, a person should consume no more than 15 to 17 g of saturated fat daily. Saturated and trans fats are considered "bad" fats, and they will raise LDL cholesterol. Saturated fats are primarily found in animal products such as meats, butter, and cream. However, that does not mean you need to give up all your favorite foods. Today, there are good tasting, low-fat, low-cholesterol substitutes for most of the things you like to eat. Choose low-fat or nonfat alternatives. Choose round or loin cuts of red meat. These types of cuts are lowest in fat and cholesterol. Chicken (without the skin), fish, veal, and ground [place] breast are great choices. Eliminate fatty meats, such as hot dogs and salami. Even shellfish have little or no saturated fat. Have a 3 oz (85 g) portion when you eat lean meat, poultry, or fish. Trans fats are also  called "partially hydrogenated oils." They are oils that have been scientifically manipulated so that they are solid at room temperature resulting in a longer shelf life and improved taste and texture of foods in which they are added. Trans fats are found in stick margarine, some tub margarines, cookies, crackers, and baked goods.   When baking and cooking, oils are a great substitute for butter. The monounsaturated oils are especially beneficial since it is believed they lower LDL and raise HDL. The oils you should avoid entirely are saturated tropical oils, such as coconut and palm.  Remember to eat a lot from food groups that are naturally free of saturated and trans fat, including fish, fruit, vegetables, beans, grains (barley, rice, couscous, bulgur wheat), and pasta (without cream sauces).  IDENTIFYING FOODS THAT LOWER CHOLESTEROL  Soluble fiber may lower your cholesterol. This type of fiber is found in fruits such as apples, vegetables such as broccoli, potatoes, and carrots, legumes such as beans, peas, and lentils, and grains such as barley. Foods fortified with plant sterols (phytosterol) may also lower cholesterol. You should eat at least 2 g per day of these foods for a cholesterol lowering effect.  Read package labels to identify low-saturated fats, trans fat free, and low-fat foods at the supermarket. Select cheeses that have only 2 to 3 g saturated fat per ounce. Use a heart-healthy tub margarine that is free of trans fats or partially hydrogenated oil. When buying baked goods (cookies, crackers), avoid partially hydrogenated oils. Breads and muffins should be made from whole grains (whole-wheat or whole oat flour, instead of "flour" or "enriched flour"). Buy non-creamy canned soups with reduced salt and no added fats.  FOOD PREPARATION TECHNIQUES  Never deep-fry. If you must fry, either stir-fry, which uses very little fat, or use non-stick cooking sprays. When possible, broil, bake, or roast meats, and steam vegetables. Instead of putting butter or margarine on vegetables, use lemon and herbs, applesauce, and cinnamon (for squash and sweet potatoes), nonfat yogurt, salsa, and low-fat dressings for salads.  LOW-SATURATED FAT / LOW-FAT FOOD SUBSTITUTES Meats / Saturated Fat (g)  Avoid: Steak, marbled (3 oz/85 g) / 11  g  Choose: Steak, lean (3 oz/85 g) / 4 g  Avoid: Hamburger (3 oz/85 g) / 7 g  Choose: Hamburger, lean (3 oz/85 g) / 5 g  Avoid: Ham (3 oz/85 g) / 6 g  Choose: Ham, lean cut (3 oz/85 g) / 2.4 g  Avoid: Chicken, with skin, dark meat (3 oz/85 g) / 4 g  Choose: Chicken, skin removed, dark meat (3 oz/85 g) / 2 g  Avoid: Chicken, with skin, light meat (3 oz/85 g) / 2.5 g  Choose: Chicken, skin removed, light meat (3 oz/85 g) / 1 g Dairy / Saturated Fat (g)  Avoid: Whole milk (1 cup) / 5 g  Choose: Low-fat milk, 2% (1 cup) / 3 g  Choose: Low-fat milk, 1% (1 cup) / 1.5 g  Choose: Skim milk (1 cup) / 0.3 g  Avoid: Hard cheese (1 oz/28 g) / 6 g  Choose: Skim milk cheese (1 oz/28 g) / 2 to 3 g  Avoid: Cottage cheese, 4% fat (1 cup) / 6.5 g  Choose: Low-fat cottage cheese, 1% fat (1 cup) / 1.5 g  Avoid: Ice cream (1 cup) / 9 g  Choose: Sherbet (1 cup) / 2.5 g  Choose: Nonfat frozen yogurt (1 cup) / 0.3 g  Choose: Frozen fruit bar / trace  Avoid: Whipped cream (1  tbs) / 3.5 g  Choose: Nondairy whipped topping (1 tbs) / 1 g Condiments / Saturated Fat (g)  Avoid: Mayonnaise (1 tbs) / 2 g  Choose: Low-fat mayonnaise (1 tbs) / 1 g  Avoid: Butter (1 tbs) / 7 g  Choose: Extra light margarine (1 tbs) / 1 g  Avoid: Coconut oil (1 tbs) / 11.8 g  Choose: Olive oil (1 tbs) / 1.8 g  Choose: Corn oil (1 tbs) / 1.7 g  Choose: Safflower oil (1 tbs) / 1.2 g  Choose: Sunflower oil (1 tbs) / 1.4 g  Choose: Soybean oil (1 tbs) / 2.4 g  Choose: Canola oil (1 tbs) / 1 g Document Released: 07/08/2005 Document Revised: 09/30/2011 Document Reviewed: 12/27/2010 [facility] Patient Information 2013 [place], [place].   Acute Pancreatitis Acute pancreatitis is a disease in which the pancreas becomes suddenly inflamed. The pancreas is a large gland located behind your stomach. The pancreas produces enzymes that help digest food. The pancreas also releases the hormones glucagon and  insulin that help regulate blood sugar. Damage to the pancreas occurs when the digestive enzymes from the pancreas are activated and begin attacking the pancreas before being released into the intestine. Most acute attacks last a couple of days and can cause serious complications. Some people become dehydrated and develop low blood pressure. In severe cases, bleeding into the pancreas can lead to shock and can be life-threatening. The lungs, heart, and kidneys may fail. CAUSES  Pancreatitis can happen to anyone. In some cases, the cause is unknown. Most cases are caused by:  Alcohol abuse.  Gallstones. Other less common causes are:  Certain medicines.  Exposure to certain chemicals.  Infection.  Damage caused by an accident (trauma).  Abdominal surgery. SYMPTOMS   Pain in the upper abdomen that may radiate to the back.  Tenderness and swelling of the abdomen.  Nausea and vomiting. DIAGNOSIS  Your caregiver will perform a physical exam. Blood and stool tests may be done to confirm the diagnosis. Imaging tests may also be done, such as X-rays, CT scans, or an ultrasound of the abdomen. TREATMENT  Treatment usually requires a stay in the hospital. Treatment may include:  Pain medicine.  Fluid replacement through an intravenous line (IV).  Placing a tube in the stomach to remove stomach contents and control vomiting.  Not eating for 3 or 4 days. This gives your pancreas a rest, because enzymes are not being produced that can cause further damage.  Antibiotic medicines if your condition is caused by an infection.  Surgery of the pancreas or gallbladder. HOME CARE INSTRUCTIONS   Follow the diet advised by your caregiver. This may involve avoiding alcohol and decreasing the amount of fat in your diet.  Eat smaller, more frequent meals. This reduces the amount of digestive juices the pancreas produces.  Drink enough fluids to keep your urine clear or pale yellow.  Only take  over-the-counter or prescription medicines as directed by your caregiver.  Avoid drinking alcohol if it caused your condition.  Do not smoke.  Get plenty of rest.  Check your blood sugar at home as directed by your caregiver.  Keep all follow-up appointments as directed by your caregiver. SEEK MEDICAL CARE IF:   You do not recover as quickly as expected.  You develop new or worsening symptoms.  You have persistent pain, weakness, or nausea.  You recover and then have another episode of pain. SEEK IMMEDIATE MEDICAL CARE IF:   You are unable to eat  or keep fluids down.  Your pain becomes severe.  You have a fever or persistent symptoms for more than 2 to 3 days.  You have a fever and your symptoms suddenly get worse.  Your skin or the white part of your eyes turn yellow (jaundice).  You develop vomiting.  You feel dizzy, or you faint.  Your blood sugar is high (over 300 mg/dL). MAKE SURE YOU:   Understand these instructions.  Will watch your condition.  Will get help right away if you are not doing well or get worse. Document Released: 07/08/2005 Document Revised: 01/07/2012 Document Reviewed: 10/17/2011 [facility] Patient Information 2013 [place], [place].

## 2012-10-07 NOTE — Progress Notes (Signed)
Faxed to PCP

## 2014-09-12 ENCOUNTER — Other Ambulatory Visit: Payer: Self-pay | Admitting: Endocrinology

## 2014-09-12 DIAGNOSIS — E049 Nontoxic goiter, unspecified: Secondary | ICD-10-CM

## 2014-10-07 ENCOUNTER — Ambulatory Visit
Admission: RE | Admit: 2014-10-07 | Discharge: 2014-10-07 | Disposition: A | Discharge status: Home / Self Care | Payer: PRIVATE HEALTH INSURANCE | Source: Ambulatory Visit | Attending: Endocrinology | Admitting: Endocrinology

## 2014-10-07 DIAGNOSIS — E049 Nontoxic goiter, unspecified: Secondary | ICD-10-CM

## 2015-05-29 ENCOUNTER — Encounter (HOSPITAL_COMMUNITY): Payer: Self-pay | Admitting: Emergency Medicine

## 2015-05-29 ENCOUNTER — Emergency Department (HOSPITAL_COMMUNITY)
Admission: EM | Admit: 2015-05-29 | Discharge: 2015-05-29 | Disposition: A | Discharge status: Home / Self Care | Payer: PRIVATE HEALTH INSURANCE | Attending: Emergency Medicine | Admitting: Emergency Medicine

## 2015-05-29 ENCOUNTER — Emergency Department (HOSPITAL_COMMUNITY): Payer: PRIVATE HEALTH INSURANCE

## 2015-05-29 DIAGNOSIS — Z79899 Other long term (current) drug therapy: Secondary | ICD-10-CM | POA: Insufficient documentation

## 2015-05-29 DIAGNOSIS — E876 Hypokalemia: Secondary | ICD-10-CM | POA: Diagnosis not present

## 2015-05-29 DIAGNOSIS — Z794 Long term (current) use of insulin: Secondary | ICD-10-CM | POA: Diagnosis not present

## 2015-05-29 DIAGNOSIS — E119 Type 2 diabetes mellitus without complications: Secondary | ICD-10-CM | POA: Diagnosis not present

## 2015-05-29 DIAGNOSIS — Z91040 Latex allergy status: Secondary | ICD-10-CM | POA: Insufficient documentation

## 2015-05-29 DIAGNOSIS — R1011 Right upper quadrant pain: Secondary | ICD-10-CM | POA: Diagnosis present

## 2015-05-29 DIAGNOSIS — K8500 Idiopathic acute pancreatitis without necrosis or infection: Secondary | ICD-10-CM | POA: Diagnosis not present

## 2015-05-29 LAB — CBC WITH DIFFERENTIAL/PLATELET
Basophils Absolute: 0 10*3/uL (ref 0.0–0.1)
Basophils Relative: 0 %
Eosinophils Absolute: 0.1 10*3/uL (ref 0.0–0.7)
Eosinophils Relative: 1 %
HEMATOCRIT: 33.2 % — AB (ref 36.0–46.0)
HEMOGLOBIN: 10.9 g/dL — AB (ref 12.0–15.0)
LYMPHS ABS: 2 10*3/uL (ref 0.7–4.0)
LYMPHS PCT: 18 %
MCH: 26.5 pg (ref 26.0–34.0)
MCHC: 32.8 g/dL (ref 30.0–36.0)
MCV: 80.8 fL (ref 78.0–100.0)
MONO ABS: 0.6 10*3/uL (ref 0.1–1.0)
MONOS PCT: 5 %
NEUTROS ABS: 8.5 10*3/uL — AB (ref 1.7–7.7)
NEUTROS PCT: 76 %
Platelets: 252 10*3/uL (ref 150–400)
RBC: 4.11 MIL/uL (ref 3.87–5.11)
RDW: 14.6 % (ref 11.5–15.5)
WBC: 11.2 10*3/uL — ABNORMAL HIGH (ref 4.0–10.5)

## 2015-05-29 LAB — URINALYSIS, ROUTINE W REFLEX MICROSCOPIC
BILIRUBIN URINE: NEGATIVE
Glucose, UA: 500 mg/dL — AB
HGB URINE DIPSTICK: NEGATIVE
Ketones, ur: NEGATIVE mg/dL
Leukocytes, UA: NEGATIVE
Nitrite: NEGATIVE
PH: 6 (ref 5.0–8.0)
Protein, ur: NEGATIVE mg/dL
SPECIFIC GRAVITY, URINE: 1.025 (ref 1.005–1.030)
Urobilinogen, UA: 0.2 mg/dL (ref 0.0–1.0)

## 2015-05-29 LAB — COMPREHENSIVE METABOLIC PANEL
ALT: 11 U/L — ABNORMAL LOW (ref 14–54)
ANION GAP: 4 — AB (ref 5–15)
AST: 13 U/L — ABNORMAL LOW (ref 15–41)
Albumin: 2.8 g/dL — ABNORMAL LOW (ref 3.5–5.0)
Alkaline Phosphatase: 54 U/L (ref 38–126)
BILIRUBIN TOTAL: 0.2 mg/dL — AB (ref 0.3–1.2)
BUN: 8 mg/dL (ref 6–20)
CHLORIDE: 113 mmol/L — AB (ref 101–111)
CO2: 20 mmol/L — AB (ref 22–32)
Calcium: 7.2 mg/dL — ABNORMAL LOW (ref 8.9–10.3)
Creatinine, Ser: 0.37 mg/dL — ABNORMAL LOW (ref 0.44–1.00)
GLUCOSE: 226 mg/dL — AB (ref 65–99)
POTASSIUM: 2.9 mmol/L — AB (ref 3.5–5.1)
Sodium: 137 mmol/L (ref 135–145)
Total Protein: 6 g/dL — ABNORMAL LOW (ref 6.5–8.1)

## 2015-05-29 LAB — LIPASE, BLOOD: Lipase: 117 U/L — ABNORMAL HIGH (ref 11–51)

## 2015-05-29 LAB — I-STAT BETA HCG BLOOD, ED (MC, WL, AP ONLY)

## 2015-05-29 MED ORDER — IOHEXOL 300 MG/ML  SOLN
50.0000 mL | Freq: Once | INTRAMUSCULAR | Status: AC | PRN
  Administered 2015-05-29: 50 mL via ORAL

## 2015-05-29 MED ORDER — SODIUM CHLORIDE 0.9 % IV BOLUS (SEPSIS)
1000.0000 mL | Freq: Once | INTRAVENOUS | Status: AC
  Administered 2015-05-29: 1000 mL via INTRAVENOUS

## 2015-05-29 MED ORDER — ONDANSETRON HCL 4 MG/2ML IJ SOLN
4.0000 mg | Freq: Once | INTRAMUSCULAR | Status: AC
  Administered 2015-05-29: 4 mg via INTRAVENOUS
  Filled 2015-05-29: qty 2

## 2015-05-29 MED ORDER — ONDANSETRON HCL 4 MG PO TABS: 4.0000 mg | ORAL_TABLET | Freq: Three times a day (TID) | ORAL | Status: DC | PRN

## 2015-05-29 MED ORDER — IOHEXOL 300 MG/ML  SOLN
100.0000 mL | Freq: Once | INTRAMUSCULAR | Status: AC | PRN
  Administered 2015-05-29: 100 mL via INTRAVENOUS

## 2015-05-29 MED ORDER — OXYCODONE-ACETAMINOPHEN 5-325 MG PO TABS: 1.0000 | ORAL_TABLET | ORAL | Status: DC | PRN

## 2015-05-29 MED ORDER — HYDROMORPHONE HCL 1 MG/ML IJ SOLN
1.0000 mg | Freq: Once | INTRAMUSCULAR | Status: AC
  Administered 2015-05-29: 1 mg via INTRAVENOUS
  Filled 2015-05-29: qty 1

## 2015-05-29 NOTE — ED Provider Notes (Signed)
CSN: [id number]     Arrival date & time 05/29/15  1857 History   First MD Initiated Contact with Patient 05/29/15 1918     Chief Complaint  Patient presents with  . Flank Pain     (Consider location/radiation/quality/duration/timing/severity/associated sxs/prior Treatment) HPI Comments: The pt is a 44 y/o female with a hx of pancreatitis that occurred 2-1/2 years ago, she was seen at this hospital, had an ultrasound which was unremarkable, lab work showing lipase numbers in the 150 range. She improved, they thought this was related to medications, she does not drink alcohol, she still has a gallbladder. Approximately 3 days ago she developed recurrent upper abdominal pain mostly in the right upper quadrant and flank. This is worse with eating, there is nausea but no vomiting, no changes in stools, no difficulty urinating. Denies fevers or chills. Symptoms are persistent, this does not go away but does get worse with eating. Denies prior abdominal surgical history.  Patient is a 44 y.o. female presenting with flank pain. The history is provided by the patient.  Flank Pain    Past Medical History  Diagnosis Date  . Diabetes mellitus without complication ([place])   . Pancreatitis, acute 09/29/2012   Past Surgical History  Procedure Laterality Date  . None     Family History  Problem Relation Age of Onset  . Diabetes Father   . Polymyalgia rheumatica Mother   . Sarcoidosis Mother   . Colon cancer Neg Hx    Social History  Substance Use Topics  . Smoking status: Never Smoker   . Smokeless tobacco: None  . Alcohol Use: No   OB History    No data available     Review of Systems  Genitourinary: Positive for flank pain.  All other systems reviewed and are negative.     Allergies  Aspirin; Ibuprofen; Latex; and Other  Home Medications   Prior to Admission medications   Medication Sig Start Date End Date Taking? Authorizing Provider  acetaminophen (TYLENOL) 500 MG tablet  Take 500 mg by mouth every 6 (six) hours as needed for mild pain or moderate pain.   Yes Historical Provider, MD  fexofenadine (ALLEGRA) 180 MG tablet Take 180 mg by mouth daily.   Yes Historical Provider, MD  insulin glargine (LANTUS) 100 UNIT/ML injection Inject 15 Units into the skin at bedtime. Patient taking differently: Inject 30 Units into the skin at bedtime.  10/03/12  Yes Kathie Dike, MD  metFORMIN (GLUCOPHAGE-XR) 500 MG 24 hr tablet Take 1,000 mg by mouth 2 (two) times daily.  04/05/15  Yes Historical Provider, MD  Multiple Vitamin (MULTIVITAMIN) tablet Take 1 tablet by mouth daily.   Yes Historical Provider, MD  ondansetron (ZOFRAN) 4 MG tablet Take 1 tablet (4 mg total) by mouth every 8 (eight) hours as needed for nausea or vomiting. 05/29/15   Noemi Chapel, MD  oxyCODONE-acetaminophen (PERCOCET) 5-325 MG tablet Take 1 tablet by mouth every 4 (four) hours as needed. 05/29/15   Noemi Chapel, MD   BP 135/74 mmHg  Pulse 70  Temp(Src) 98.5 F (36.9 C) (Oral)  Resp 16  Ht 5\' 11"  (1.803 m)  Wt 225 lb (102.059 kg)  BMI 31.39 kg/m2  SpO2 97%  LMP 05/23/2015 Physical Exam  Constitutional: She appears well-developed and well-nourished. No distress.  HENT:  Head: Normocephalic and atraumatic.  Mouth/Throat: Oropharynx is clear and moist. No oropharyngeal exudate.  Eyes: Conjunctivae and EOM are normal. Pupils are equal, round, and reactive to light. Right  eye exhibits no discharge. Left eye exhibits no discharge. No scleral icterus.  Neck: Normal range of motion. Neck supple. No JVD present. No thyromegaly present.  Cardiovascular: Normal rate, regular rhythm, normal heart sounds and intact distal pulses.  Exam reveals no gallop and no friction rub.   No murmur heard. Pulmonary/Chest: Effort normal and breath sounds normal. No respiratory distress. She has no wheezes. She has no rales.  Abdominal: Soft. Bowel sounds are normal. She exhibits no distension and no mass. There is tenderness  ( ttp in teh RUQ and epigastrium, no guarding or masses).  Musculoskeletal: Normal range of motion. She exhibits no edema or tenderness.  Lymphadenopathy:    She has no cervical adenopathy.  Neurological: She is alert. Coordination normal.  Skin: Skin is warm and dry. No rash noted. No erythema.  Psychiatric: She has a normal mood and affect. Her behavior is normal.  Nursing note and vitals reviewed.   ED Course  Procedures (including critical care time) Labs Review Labs Reviewed  LIPASE, BLOOD - Abnormal; Notable for the following:    Lipase 117 (*)    All other components within normal limits  COMPREHENSIVE METABOLIC PANEL - Abnormal; Notable for the following:    Potassium 2.9 (*)    Chloride 113 (*)    CO2 20 (*)    Glucose, Bld 226 (*)    Creatinine, Ser 0.37 (*)    Calcium 7.2 (*)    Total Protein 6.0 (*)    Albumin 2.8 (*)    AST 13 (*)    ALT 11 (*)    Total Bilirubin 0.2 (*)    Anion gap 4 (*)    All other components within normal limits  CBC WITH DIFFERENTIAL/PLATELET - Abnormal; Notable for the following:    WBC 11.2 (*)    Hemoglobin 10.9 (*)    HCT 33.2 (*)    Neutro Abs 8.5 (*)    All other components within normal limits  URINALYSIS, ROUTINE W REFLEX MICROSCOPIC (NOT AT [facility]) - Abnormal; Notable for the following:    Glucose, UA 500 (*)    All other components within normal limits  I-STAT BETA HCG BLOOD, ED ([facility], [facility], [facility] ONLY)    Imaging Review Ct Abdomen Pelvis W Contrast  05/29/2015  CLINICAL DATA:  Right flank and upper quadrant pain for 2 days. Epigastric pain. History of pancreatitis. EXAM: CT ABDOMEN AND PELVIS WITH CONTRAST TECHNIQUE: Multidetector CT imaging of the abdomen and pelvis was performed using the standard protocol following bolus administration of intravenous contrast. CONTRAST:  143mL OMNIPAQUE IOHEXOL 300 MG/ML SOLN, 53mL OMNIPAQUE IOHEXOL 300 MG/ML SOLN COMPARISON:  None. FINDINGS: Lower chest:  The included lung bases are clear. Liver:  Mild decreased density suggestive of steatosis. No focal lesion. Hepatobiliary: Gallbladder physiologically distended. No calcified stone. No biliary dilatation. Pancreas: Mild stranding about the peripancreatic fat about the body of the pancreas consistent with pancreatitis. No ductal dilatation. No peripancreatic fluid collection. Homogeneous enhancement of the pancreatic parenchyma. The splenic vein is patent. Spleen: Normal.  Small splenule anteriorly. Adrenal glands: No nodule. Kidneys: Symmetric renal enhancement and excretion. No hydronephrosis. Stomach/Bowel: Stomach physiologically distended. There are no dilated or thickened small bowel loops. Moderate volume of stool throughout the colon without colonic wall thickening. The appendix is normal. Vascular/Lymphatic: No retroperitoneal adenopathy. Abdominal aorta is normal in caliber. Reproductive: Uterus anteverted and normal in size. Ovaries symmetric in size. Bladder: Ni completely decompressed and not well evaluated. Other: No free air, free fluid,  or intra-abdominal fluid collection. Musculoskeletal: There are no acute or suspicious osseous abnormalities. IMPRESSION: Findings consistent with mild acute pancreatitis with soft tissue stranding about the mid pancreas. No pancreatitis complications identified. Electronically Signed   By: Jeb Levering M.D.   On: 05/29/2015 20:59   I have personally reviewed and evaluated these images and lab results as part of my medical decision-making.    MDM   Final diagnoses:  Idiopathic acute pancreatitis  Hypokalemia    Possible recurrent pancreatitis, possibly cholecystitis, labs pending, CT scan ordered, medications for comfort, fluids.  K replaced, pain better with dilaudid, stable for d/c - CT without acute findings other than mild pancreatitis - labs support this.  Pt and friend express undersetanding to the tx plan.  Meds given in ED:  Medications  sodium chloride 0.9 % bolus 1,000 mL (0  mLs Intravenous Stopped 05/29/15 2051)  ondansetron (ZOFRAN) injection 4 mg (4 mg Intravenous Given 05/29/15 1950)  HYDROmorphone (DILAUDID) injection 1 mg (1 mg Intravenous Given 05/29/15 1950)  iohexol (OMNIPAQUE) 300 MG/ML solution 100 mL (100 mLs Intravenous Contrast Given 05/29/15 2040)  iohexol (OMNIPAQUE) 300 MG/ML solution 50 mL (50 mLs Oral Contrast Given 05/29/15 2040)    New Prescriptions   ONDANSETRON (ZOFRAN) 4 MG TABLET    Take 1 tablet (4 mg total) by mouth every 8 (eight) hours as needed for nausea or vomiting.   OXYCODONE-ACETAMINOPHEN (PERCOCET) 5-325 MG TABLET    Take 1 tablet by mouth every 4 (four) hours as needed.      Noemi Chapel, MD 05/29/15 2115

## 2015-05-29 NOTE — Discharge Instructions (Signed)

## 2015-05-29 NOTE — ED Notes (Signed)
Patient complaining of right flank pain since Saturday. States she has history of pancreatitis with same symptoms 3 years ago.

## 2015-05-31 ENCOUNTER — Inpatient Hospital Stay (HOSPITAL_COMMUNITY)
Admission: EM | Admit: 2015-05-31 | Discharge: 2015-06-05 | DRG: 439 | Disposition: A | Discharge status: Home / Self Care | Payer: PRIVATE HEALTH INSURANCE | Attending: Internal Medicine | Admitting: Internal Medicine

## 2015-05-31 ENCOUNTER — Encounter (HOSPITAL_COMMUNITY): Payer: Self-pay | Admitting: Emergency Medicine

## 2015-05-31 DIAGNOSIS — J302 Other seasonal allergic rhinitis: Secondary | ICD-10-CM

## 2015-05-31 DIAGNOSIS — E669 Obesity, unspecified: Secondary | ICD-10-CM

## 2015-05-31 DIAGNOSIS — IMO0002 Reserved for concepts with insufficient information to code with codable children: Secondary | ICD-10-CM | POA: Diagnosis present

## 2015-05-31 DIAGNOSIS — D649 Anemia, unspecified: Secondary | ICD-10-CM | POA: Diagnosis present

## 2015-05-31 DIAGNOSIS — E119 Type 2 diabetes mellitus without complications: Secondary | ICD-10-CM

## 2015-05-31 DIAGNOSIS — D509 Iron deficiency anemia, unspecified: Secondary | ICD-10-CM | POA: Diagnosis present

## 2015-05-31 DIAGNOSIS — E871 Hypo-osmolality and hyponatremia: Secondary | ICD-10-CM | POA: Diagnosis not present

## 2015-05-31 DIAGNOSIS — Z6831 Body mass index (BMI) 31.0-31.9, adult: Secondary | ICD-10-CM | POA: Diagnosis not present

## 2015-05-31 DIAGNOSIS — K5909 Other constipation: Secondary | ICD-10-CM | POA: Diagnosis not present

## 2015-05-31 DIAGNOSIS — D72829 Elevated white blood cell count, unspecified: Secondary | ICD-10-CM | POA: Diagnosis present

## 2015-05-31 DIAGNOSIS — K861 Other chronic pancreatitis: Secondary | ICD-10-CM | POA: Diagnosis present

## 2015-05-31 DIAGNOSIS — Z794 Long term (current) use of insulin: Secondary | ICD-10-CM | POA: Diagnosis not present

## 2015-05-31 DIAGNOSIS — K567 Ileus, unspecified: Secondary | ICD-10-CM | POA: Diagnosis not present

## 2015-05-31 DIAGNOSIS — Z833 Family history of diabetes mellitus: Secondary | ICD-10-CM

## 2015-05-31 DIAGNOSIS — E1165 Type 2 diabetes mellitus with hyperglycemia: Secondary | ICD-10-CM | POA: Diagnosis present

## 2015-05-31 DIAGNOSIS — Z7984 Long term (current) use of oral hypoglycemic drugs: Secondary | ICD-10-CM

## 2015-05-31 DIAGNOSIS — K859 Acute pancreatitis without necrosis or infection, unspecified: Secondary | ICD-10-CM | POA: Diagnosis present

## 2015-05-31 DIAGNOSIS — K8500 Idiopathic acute pancreatitis without necrosis or infection: Secondary | ICD-10-CM | POA: Diagnosis not present

## 2015-05-31 DIAGNOSIS — R109 Unspecified abdominal pain: Secondary | ICD-10-CM | POA: Diagnosis present

## 2015-05-31 DIAGNOSIS — K5900 Constipation, unspecified: Secondary | ICD-10-CM | POA: Diagnosis present

## 2015-05-31 LAB — COMPREHENSIVE METABOLIC PANEL
ALK PHOS: 68 U/L (ref 38–126)
ALK PHOS: 59 U/L (ref 38–126)
ALT: 12 U/L — ABNORMAL LOW (ref 14–54)
ALT: 12 U/L — AB (ref 14–54)
ANION GAP: 10 (ref 5–15)
AST: 12 U/L — ABNORMAL LOW (ref 15–41)
AST: 9 U/L — ABNORMAL LOW (ref 15–41)
Albumin: 3.7 g/dL (ref 3.5–5.0)
Albumin: 3.2 g/dL — ABNORMAL LOW (ref 3.5–5.0)
Anion gap: 6 (ref 5–15)
BILIRUBIN TOTAL: 0.3 mg/dL (ref 0.3–1.2)
BUN: 6 mg/dL (ref 6–20)
BUN: 7 mg/dL (ref 6–20)
CALCIUM: 9.2 mg/dL (ref 8.9–10.3)
CHLORIDE: 103 mmol/L (ref 101–111)
CO2: 24 mmol/L (ref 22–32)
CO2: 26 mmol/L (ref 22–32)
Calcium: 8.6 mg/dL — ABNORMAL LOW (ref 8.9–10.3)
Chloride: 101 mmol/L (ref 101–111)
Creatinine, Ser: 0.48 mg/dL (ref 0.44–1.00)
Creatinine, Ser: 0.47 mg/dL (ref 0.44–1.00)
GFR calc non Af Amer: >60 mL/min (ref >60)
GFR calc non Af Amer: >60 mL/min (ref >60)
Glucose, Bld: 200 mg/dL — ABNORMAL HIGH (ref 65–99)
Glucose, Bld: 192 mg/dL — ABNORMAL HIGH (ref 65–99)
POTASSIUM: 4.2 mmol/L (ref 3.5–5.1)
Potassium: 3.9 mmol/L (ref 3.5–5.1)
SODIUM: 135 mmol/L (ref 135–145)
Sodium: 135 mmol/L (ref 135–145)
TOTAL PROTEIN: 8 g/dL (ref 6.5–8.1)
Total Bilirubin: 0.4 mg/dL (ref 0.3–1.2)
Total Protein: 7.2 g/dL (ref 6.5–8.1)

## 2015-05-31 LAB — CBC WITH DIFFERENTIAL/PLATELET
Basophils Absolute: 0 10*3/uL (ref 0.0–0.1)
Basophils Relative: 0 %
EOS ABS: 0.1 10*3/uL (ref 0.0–0.7)
EOS PCT: 0 %
HCT: 33.1 % — ABNORMAL LOW (ref 36.0–46.0)
HEMOGLOBIN: 11.2 g/dL — AB (ref 12.0–15.0)
LYMPHS ABS: 1.5 10*3/uL (ref 0.7–4.0)
Lymphocytes Relative: 12 %
MCH: 26.9 pg (ref 26.0–34.0)
MCHC: 33.8 g/dL (ref 30.0–36.0)
MCV: 79.6 fL (ref 78.0–100.0)
MONO ABS: 0.6 10*3/uL (ref 0.1–1.0)
MONOS PCT: 5 %
NEUTROS PCT: 82 %
Neutro Abs: 9.7 10*3/uL — ABNORMAL HIGH (ref 1.7–7.7)
Platelets: 226 10*3/uL (ref 150–400)
RBC: 4.16 MIL/uL (ref 3.87–5.11)
RDW: 14.3 % (ref 11.5–15.5)
WBC: 11.9 10*3/uL — ABNORMAL HIGH (ref 4.0–10.5)

## 2015-05-31 LAB — CBC
HEMATOCRIT: 31.2 % — AB (ref 36.0–46.0)
Hemoglobin: 10.2 g/dL — ABNORMAL LOW (ref 12.0–15.0)
MCH: 26.5 pg (ref 26.0–34.0)
MCHC: 32.7 g/dL (ref 30.0–36.0)
MCV: 81 fL (ref 78.0–100.0)
Platelets: 225 10*3/uL (ref 150–400)
RBC: 3.85 MIL/uL — ABNORMAL LOW (ref 3.87–5.11)
RDW: 14.7 % (ref 11.5–15.5)
WBC: 9.7 10*3/uL (ref 4.0–10.5)

## 2015-05-31 LAB — GLUCOSE, CAPILLARY
GLUCOSE-CAPILLARY: 162 mg/dL — AB (ref 65–99)
Glucose-Capillary: 160 mg/dL — ABNORMAL HIGH (ref 65–99)
Glucose-Capillary: 142 mg/dL — ABNORMAL HIGH (ref 65–99)
Glucose-Capillary: 141 mg/dL — ABNORMAL HIGH (ref 65–99)

## 2015-05-31 LAB — LIPASE, BLOOD: Lipase: 127 U/L — ABNORMAL HIGH (ref 11–51)

## 2015-05-31 LAB — PROTIME-INR
INR: 1.1 (ref 0.00–1.49)
Prothrombin Time: 14.4 seconds (ref 11.6–15.2)

## 2015-05-31 LAB — HCG, QUANTITATIVE, PREGNANCY: hCG, Beta Chain, Quant, S: <1 m[IU]/mL (ref <5)

## 2015-05-31 LAB — LIPID PANEL
CHOL/HDL RATIO: 3 ratio
Cholesterol: 76 mg/dL (ref 0–200)
HDL: 25 mg/dL — ABNORMAL LOW (ref >40)
LDL CALC: 42 mg/dL (ref 0–99)
Triglycerides: 46 mg/dL (ref <150)
VLDL: 9 mg/dL (ref 0–40)

## 2015-05-31 MED ORDER — OXYCODONE-ACETAMINOPHEN 5-325 MG PO TABS: 1.0000 | ORAL_TABLET | ORAL | Status: DC | PRN

## 2015-05-31 MED ORDER — SODIUM CHLORIDE 0.9 % IV SOLN
INTRAVENOUS | Status: DC
  Administered 2015-05-31 – 2015-06-02 (×4): via INTRAVENOUS

## 2015-05-31 MED ORDER — INSULIN GLARGINE 100 UNIT/ML ~~LOC~~ SOLN
20.0000 [IU] | Freq: Every day | SUBCUTANEOUS | Status: DC
  Filled 2015-05-31: qty 0.2

## 2015-05-31 MED ORDER — SODIUM CHLORIDE 0.9 % IV BOLUS (SEPSIS)
1000.0000 mL | Freq: Once | INTRAVENOUS | Status: AC
  Administered 2015-05-31: 1000 mL via INTRAVENOUS

## 2015-05-31 MED ORDER — HEPARIN SODIUM (PORCINE) 5000 UNIT/ML IJ SOLN
5000.0000 [IU] | Freq: Three times a day (TID) | INTRAMUSCULAR | Status: DC
  Administered 2015-05-31 – 2015-06-05 (×15): 5000 [IU] via SUBCUTANEOUS
  Filled 2015-05-31 (×15): qty 1

## 2015-05-31 MED ORDER — ONDANSETRON HCL 4 MG/2ML IJ SOLN
4.0000 mg | Freq: Once | INTRAMUSCULAR | Status: AC
  Administered 2015-05-31: 4 mg via INTRAVENOUS
  Filled 2015-05-31: qty 2

## 2015-05-31 MED ORDER — HYDROMORPHONE HCL 1 MG/ML IJ SOLN
1.0000 mg | Freq: Once | INTRAMUSCULAR | Status: AC
  Administered 2015-05-31: 1 mg via INTRAVENOUS
  Filled 2015-05-31: qty 1

## 2015-05-31 MED ORDER — ADULT MULTIVITAMIN W/MINERALS CH
1.0000 | ORAL_TABLET | Freq: Every day | ORAL | Status: DC
  Filled 2015-05-31: qty 1

## 2015-05-31 MED ORDER — INSULIN ASPART 100 UNIT/ML ~~LOC~~ SOLN
0.0000 [IU] | Freq: Three times a day (TID) | SUBCUTANEOUS | Status: DC
  Administered 2015-05-31 (×2): 1 [IU] via SUBCUTANEOUS
  Administered 2015-05-31 – 2015-06-01 (×3): 2 [IU] via SUBCUTANEOUS
  Administered 2015-06-01 – 2015-06-02 (×4): 1 [IU] via SUBCUTANEOUS
  Administered 2015-06-03: 2 [IU] via SUBCUTANEOUS
  Administered 2015-06-03: 1 [IU] via SUBCUTANEOUS
  Administered 2015-06-03: 2 [IU] via SUBCUTANEOUS
  Administered 2015-06-04: 1 [IU] via SUBCUTANEOUS
  Administered 2015-06-04 (×2): 2 [IU] via SUBCUTANEOUS
  Administered 2015-06-05 (×2): 1 [IU] via SUBCUTANEOUS

## 2015-05-31 MED ORDER — LORATADINE 10 MG PO TABS
10.0000 mg | ORAL_TABLET | Freq: Every day | ORAL | Status: DC
  Filled 2015-05-31 (×4): qty 1

## 2015-05-31 MED ORDER — SODIUM CHLORIDE 0.9 % IV SOLN: INTRAVENOUS | Status: DC

## 2015-05-31 MED ORDER — HYDROMORPHONE HCL 1 MG/ML IJ SOLN
1.0000 mg | INTRAMUSCULAR | Status: DC | PRN
  Administered 2015-05-31 – 2015-06-02 (×18): 1 mg via INTRAVENOUS
  Filled 2015-05-31 (×18): qty 1

## 2015-05-31 MED ORDER — INSULIN GLARGINE 100 UNIT/ML ~~LOC~~ SOLN
10.0000 [IU] | Freq: Every day | SUBCUTANEOUS | Status: DC
Start: 2015-05-31 — End: 2015-06-05
  Administered 2015-05-31 – 2015-06-04 (×5): 10 [IU] via SUBCUTANEOUS
  Filled 2015-05-31 (×6): qty 0.1

## 2015-05-31 MED ORDER — ONDANSETRON HCL 4 MG/2ML IJ SOLN: 4.0000 mg | Freq: Three times a day (TID) | INTRAMUSCULAR | Status: DC | PRN

## 2015-05-31 MED ORDER — PANTOPRAZOLE SODIUM 40 MG IV SOLR
40.0000 mg | INTRAVENOUS | Status: DC
  Administered 2015-05-31 – 2015-06-02 (×3): 40 mg via INTRAVENOUS
  Filled 2015-05-31 (×3): qty 40

## 2015-05-31 MED ORDER — SODIUM CHLORIDE 0.9 % IV SOLN
INTRAVENOUS | Status: DC
  Administered 2015-05-31: 03:00:00 via INTRAVENOUS

## 2015-05-31 NOTE — Progress Notes (Signed)
Patient is a 44 year old woman with a history of diabetes mellitus and pancreatitis, who was admitted by Dr. Blaine Hamper this morning for acute pancreatitis. Patient was briefly seen and examined. Her chart, vital signs, laboratory studies were reviewed. Agree with current management with exceptions below.  -Since she is virtually nothing by mouth, will decrease Lantus to 10 units daily at bedtime. -Continue vigorous IV fluids. -Add IV Protonix.

## 2015-05-31 NOTE — Care Management Note (Signed)
Case Management Note  Patient Details  Name: Tara Kane MRN: [id number] Date of Birth: 05/05/1971  Subjective/Objective:                  Pt admitted from home with pancreatitis. Pt lives alone and will return home at discharge. Pt is independent with ADL's.  Action/Plan: No CM needs anticipated.  Expected Discharge Date:                  Expected Discharge Plan:  Home/Self Care  In-House Referral:  NA  Discharge planning Services  CM Consult  Post Acute Care Choice:  NA Choice offered to:  NA  DME Arranged:    DME Agency:     HH Arranged:    HH Agency:     Status of Service:  Completed, signed off  Medicare Important Message Given:    Date Medicare IM Given:    Medicare IM give by:    Date Additional Medicare IM Given:    Additional Medicare Important Message give by:     If discussed at [place] of Stay Meetings, dates discussed:    Additional Comments:  Joylene Draft, RN 05/31/2015, 2:15 PM

## 2015-05-31 NOTE — ED Notes (Signed)
Pt c/o continued abd pain and nausea.

## 2015-05-31 NOTE — ED Provider Notes (Signed)
TIME SEEN: 1:55 AM  CHIEF COMPLAINT: Abdominal pain, nausea  HPI: Pt is a 44 y.o. female with history of insulin-dependent diabetes, pancreatitis who presents emergency department with upper abdominal pain that is sharp, severe in nature and nausea. Patient was seen in the emergency department on 05/29/2015 and had an elevated lipase and a CT scan that showed mild pancreatitis without complications. Gallbladder appeared normal. States she was discharged home with oxycodone and nausea medicine but has been unable to control her pain at home. She is requesting admission. No vomiting or diarrhea. No fever. She denies any history of NSAID use or alcohol use. No prior abdominal surgeries.  PCP is Dr. Berdine Addison  ROS: See HPI Constitutional: no fever  Eyes: no drainage  ENT: no runny nose   Cardiovascular:  no chest pain  Resp: no SOB  GI: no vomiting GU: no dysuria Integumentary: no rash  Allergy: no hives  Musculoskeletal: no leg swelling  Neurological: no slurred speech ROS otherwise negative  PAST MEDICAL HISTORY/PAST SURGICAL HISTORY:  Past Medical History  Diagnosis Date  . Diabetes mellitus without complication ([place])   . Pancreatitis, acute 09/29/2012    MEDICATIONS:  Prior to Admission medications   Medication Sig Start Date End Date Taking? Authorizing Provider  acetaminophen (TYLENOL) 500 MG tablet Take 500 mg by mouth every 6 (six) hours as needed for mild pain or moderate pain.    Historical Provider, MD  fexofenadine (ALLEGRA) 180 MG tablet Take 180 mg by mouth daily.    Historical Provider, MD  insulin glargine (LANTUS) 100 UNIT/ML injection Inject 15 Units into the skin at bedtime. Patient taking differently: Inject 30 Units into the skin at bedtime.  10/03/12   Kathie Dike, MD  metFORMIN (GLUCOPHAGE-XR) 500 MG 24 hr tablet Take 1,000 mg by mouth 2 (two) times daily.  04/05/15   Historical Provider, MD  Multiple Vitamin (MULTIVITAMIN) tablet Take 1 tablet by mouth daily.     Historical Provider, MD  ondansetron (ZOFRAN) 4 MG tablet Take 1 tablet (4 mg total) by mouth every 8 (eight) hours as needed for nausea or vomiting. 05/29/15   Noemi Chapel, MD  oxyCODONE-acetaminophen (PERCOCET) 5-325 MG tablet Take 1 tablet by mouth every 4 (four) hours as needed. 05/29/15   Noemi Chapel, MD    ALLERGIES:  Allergies  Allergen Reactions  . Aspirin Anaphylaxis and Swelling  . Ibuprofen Anaphylaxis and Swelling  . Latex Swelling    Facial   . Other Swelling    Muscle Relaxers Cause Swelling.     SOCIAL HISTORY:  Social History  Substance Use Topics  . Smoking status: Never Smoker   . Smokeless tobacco: Not on file  . Alcohol Use: No    FAMILY HISTORY: Family History  Problem Relation Age of Onset  . Diabetes Father   . Polymyalgia rheumatica Mother   . Sarcoidosis Mother   . Colon cancer Neg Hx     EXAM: BP 128/63 mmHg  Pulse 90  Temp(Src) 97.9 F (36.6 C)  Resp 18  Ht 5\' 11"  (1.803 m)  Wt 225 lb (102.059 kg)  BMI 31.39 kg/m2  SpO2 98%  LMP 05/23/2015 CONSTITUTIONAL: Alert and oriented and responds appropriately to questions. Well-appearing; well-nourished, nontoxic, afebrile, no significant distress HEAD: Normocephalic EYES: Conjunctivae clear, PERRL ENT: normal nose; no rhinorrhea; moist mucous membranes; pharynx without lesions noted NECK: Supple, no meningismus, no LAD  CARD: RRR; S1 and S2 appreciated; no murmurs, no clicks, no rubs, no gallops RESP: Normal chest excursion  without splinting or tachypnea; breath sounds clear and equal bilaterally; no wheezes, no rhonchi, no rales, no hypoxia or respiratory distress, speaking full sentences ABD/GI: Normal bowel sounds; non-distended; soft, tender throughout the upper abdomen, negative Murphy sign, no tenderness at McBurney's point, no rebound, no guarding, no peritoneal signs BACK:  The back appears normal and is non-tender to palpation, there is no CVA tenderness EXT: Normal ROM in all joints;  non-tender to palpation; no edema; normal capillary refill; no cyanosis, no calf tenderness or swelling    SKIN: Normal color for age and race; warm NEURO: Moves all extremities equally, sensation to light touch intact diffusely, cranial nerves II through XII intact PSYCH: The patient's mood and manner are appropriate. Grooming and personal hygiene are appropriate.  MEDICAL DECISION MAKING: Patient here with continued pain from pancreatitis that she reports is not controlled with pain medication at home. She is requesting admission. We'll repeat labs. Her abdomen is nonsurgical. She is hemodynamically stable. We'll give IV fluids, Dilaudid, Zofran and reassess. I do not feel at this time she needs repeat imaging and she had a CT scan less than 48 hours ago.  ED PROGRESS: 3:00 AM  Pt's labs show mild elevation of her white blood cell count with left shift. LFTs normal. Lipase is 127. Patient continues to have pain and nausea. We'll give second dose of Dilaudid and Zofran and continue her fluids. We'll discuss with hospitalist for admission for pain control, IV hydration. We'll keep her nothing by mouth.  3:04 AM  D/w Dr. Blaine Hamper hospitalist who agrees on admission to medical bed, inpatient. I will place holding orders per his request. Patient comfortable with this plan.     [place], DO 05/31/15 [phone]

## 2015-05-31 NOTE — H&P (Signed)
[facility] History and Physical  RION SCHNITZER JQB:[id number] DOB: 11/21/1970 DOA: 05/31/2015  Referring physician: ED physician PCP: Maggie Font, MD  Specialists:   Chief Complaint: Abdominal and right flank pain.  HPI: Tara Kane is a 44 y.o. female with PMH of diabetes mellitus, pancreatitis, who presents with abdominal and right flank pain  Patient reports that she has been having pain over right abdomen and right flank in the past 4 days. The pain is constant, nonradiating, 7 out of 10 in severity. It is associated with nausea, but not vomiting or diarrhea. She was seen in emergency room 2 days ago. She had CT abdomen which showed mild pancreatitis without complications. She was discharged home with pain and antinausea medications. She has been compliant to these medications, but continues to have abdominal pain and nausea. Patient does not have diarrhea, fever, chills, chest pain, shortness of breath, symptoms of UTI, or unilateral weakness. Of note, pt had pancreatitis in 2014. Etiology was not entirely clear. In that admission, her ultrasound of the abdomen did not indicate any gallstones and her triglyceride levels were also within normal limits. Patient was on ramipril which was discontinued at discharge. She states that one of her  her cousins has history of pancreatitis.  In ED, patient was found to have pancreatitis 127, WBC 11.9, liver function normal, temperature normal, no tachycardia, electrolytes okay. Patient's admitted to inpatient for observation.  Where does patient live?   At home     Can patient participate in ADLs?  Yes      Review of Systems:   General: no fevers, chills, no changes in body weight, has poor appetite, has fatigue HEENT: no blurry vision, hearing changes or sore throat Pulm: no dyspnea, coughing, wheezing CV: no chest pain, palpitations Abd: has nausea, bdominal pain, no  diarrhea, constipation or vomiting. GU: no dysuria,  burning on urination, increased urinary frequency, hematuria  Ext: no leg edema Neuro: no unilateral weakness, numbness, or tingling, no vision change or hearing loss Skin: no rash MSK: No muscle spasm, no deformity, no limitation of range of movement in spin Heme: No easy bruising.  Travel history: No recent long distant travel.  Allergy:  Allergies  Allergen Reactions  . Aspirin Anaphylaxis and Swelling  . Ibuprofen Anaphylaxis and Swelling  . Latex Swelling    Facial   . Other Swelling    Muscle Relaxers Cause Swelling.     Past Medical History  Diagnosis Date  . Diabetes mellitus without complication ([place])   . Pancreatitis, acute 09/29/2012    Past Surgical History  Procedure Laterality Date  . None      Social History:  reports that she has never smoked. She does not have any smokeless tobacco history on file. She reports that she does not drink alcohol or use illicit drugs.  Family History:  Family History  Problem Relation Age of Onset  . Diabetes Father   . Polymyalgia rheumatica Mother   . Sarcoidosis Mother   . Colon cancer Neg Hx      Prior to Admission medications   Medication Sig Start Date End Date Taking? Authorizing Provider  acetaminophen (TYLENOL) 500 MG tablet Take 500 mg by mouth every 6 (six) hours as needed for mild pain or moderate pain.    Historical Provider, MD  fexofenadine (ALLEGRA) 180 MG tablet Take 180 mg by mouth daily.    Historical Provider, MD  insulin glargine (LANTUS) 100 UNIT/ML injection Inject 15 Units into the skin  at bedtime. Patient taking differently: Inject 30 Units into the skin at bedtime.  10/03/12   Kathie Dike, MD  metFORMIN (GLUCOPHAGE-XR) 500 MG 24 hr tablet Take 1,000 mg by mouth 2 (two) times daily.  04/05/15   Historical Provider, MD  Multiple Vitamin (MULTIVITAMIN) tablet Take 1 tablet by mouth daily.    Historical Provider, MD  ondansetron (ZOFRAN) 4 MG tablet Take 1 tablet (4 mg total) by mouth every 8 (eight)  hours as needed for nausea or vomiting. 05/29/15   Noemi Chapel, MD  oxyCODONE-acetaminophen (PERCOCET) 5-325 MG tablet Take 1 tablet by mouth every 4 (four) hours as needed. 05/29/15   Noemi Chapel, MD    Physical Exam: Filed Vitals:   05/31/15 0132  BP: 128/63  Pulse: 90  Temp: 97.9 F (36.6 C)  Resp: 18  Height: 5\' 11"  (1.803 m)  Weight: 102.059 kg (225 lb)  SpO2: 98%   General: Not in acute distress HEENT:       Eyes: PERRL, EOMI, no scleral icterus.       ENT: No discharge from the ears and nose, no pharynx injection, no tonsillar enlargement.        Neck: No JVD, no bruit, no mass felt. Heme: No neck lymph node enlargement. Cardiac: S1/S2, RRR, No murmurs, No gallops or rubs. Pulm:  No rales, wheezing, rhonchi or rubs. Abd: Soft, nondistended, tenderness over R abdomen and R flank area, no rebound pain, no organomegaly, BS present. Ext: No pitting leg edema bilaterally. 2+DP/PT pulse bilaterally. Musculoskeletal: No joint deformities, No joint redness or warmth, no limitation of ROM in spin. Skin: No rashes.  Neuro: Alert, oriented X3, cranial nerves II-XII grossly intact, muscle strength 5/5 in all extremities, sensation to light touch intact.  Psych: Patient is not psychotic, no suicidal or hemocidal ideation.  Labs on Admission:  Basic Metabolic Panel:  Recent Labs Lab 05/29/15 1915 05/31/15 0155  NA 137 135  K 2.9* 3.9  CL 113* 101  CO2 20* 24  GLUCOSE 226* 200*  BUN 8 6  CREATININE 0.37* 0.48  CALCIUM 7.2* 9.2   Liver Function Tests:  Recent Labs Lab 05/29/15 1915 05/31/15 0155  AST 13* 12*  ALT 11* 12*  ALKPHOS 54 68  BILITOT 0.2* 0.4  PROT 6.0* 8.0  ALBUMIN 2.8* 3.7    Recent Labs Lab 05/29/15 1915 05/31/15 0155  LIPASE 117* 127*   No results for input(s): AMMONIA in the last 168 hours. CBC:  Recent Labs Lab 05/29/15 1915 05/31/15 0155  WBC 11.2* 11.9*  NEUTROABS 8.5* 9.7*  HGB 10.9* 11.2*  HCT 33.2* 33.1*  MCV 80.8 79.6  PLT  252 226   Cardiac Enzymes: No results for input(s): CKTOTAL, CKMB, CKMBINDEX, TROPONINI in the last 168 hours.  BNP (last 3 results) No results for input(s): BNP in the last 8760 hours.  ProBNP (last 3 results) No results for input(s): PROBNP in the last 8760 hours.  CBG: No results for input(s): GLUCAP in the last 168 hours.  Radiological Exams on Admission: Ct Abdomen Pelvis W Contrast  05/29/2015  CLINICAL DATA:  Right flank and upper quadrant pain for 2 days. Epigastric pain. History of pancreatitis. EXAM: CT ABDOMEN AND PELVIS WITH CONTRAST TECHNIQUE: Multidetector CT imaging of the abdomen and pelvis was performed using the standard protocol following bolus administration of intravenous contrast. CONTRAST:  15mL OMNIPAQUE IOHEXOL 300 MG/ML SOLN, 29mL OMNIPAQUE IOHEXOL 300 MG/ML SOLN COMPARISON:  None. FINDINGS: Lower chest:  The included lung bases are clear.  Liver: Mild decreased density suggestive of steatosis. No focal lesion. Hepatobiliary: Gallbladder physiologically distended. No calcified stone. No biliary dilatation. Pancreas: Mild stranding about the peripancreatic fat about the body of the pancreas consistent with pancreatitis. No ductal dilatation. No peripancreatic fluid collection. Homogeneous enhancement of the pancreatic parenchyma. The splenic vein is patent. Spleen: Normal.  Small splenule anteriorly. Adrenal glands: No nodule. Kidneys: Symmetric renal enhancement and excretion. No hydronephrosis. Stomach/Bowel: Stomach physiologically distended. There are no dilated or thickened small bowel loops. Moderate volume of stool throughout the colon without colonic wall thickening. The appendix is normal. Vascular/Lymphatic: No retroperitoneal adenopathy. Abdominal aorta is normal in caliber. Reproductive: Uterus anteverted and normal in size. Ovaries symmetric in size. Bladder: Ni completely decompressed and not well evaluated. Other: No free air, free fluid, or intra-abdominal  fluid collection. Musculoskeletal: There are no acute or suspicious osseous abnormalities. IMPRESSION: Findings consistent with mild acute pancreatitis with soft tissue stranding about the mid pancreas. No pancreatitis complications identified. Electronically Signed   By: Jeb Levering M.D.   On: 05/29/2015 20:59    EKG:  Not done in ED, will get one.   Assessment/Plan Principal Problem:   Pancreatitis, acute Active Problems:   Diabetes type 2, uncontrolled ([place])   Leukocytosis   Acute pancreatitis   Pancreatitis  Pancreatitis: Patient has recurrent pancreatitis. Etiology is not clear. She does not drink alcohol. Liver function is okay with normal bilirubin, indicating less likely to have bile duct obstruction. CT abdomen/pelvis that showed mild pancreatitis without complications 2 days ago.  -will admit to med-surg bed for observation -NPO for pancreatitis -IVF: 2LNS and then at 125 cc/hr -IV dilaudid for pain control, IV zofran for nausea. Also on when necessary percocet for moderate pain. -check lipid panel to rule out triglyceridemia. -check Ig 4 for possible autoimmune pancreatitis -preg test  DM-II: Last A1c 8.1 on 09/29/12, poorly controled. Patient is taking Lantus and metformin at home -will decrease Lantus dose from 30 units to 20 units daily -SSI -Check A1c  Leukocytosis: no signs of infection. Likely due to stress induced to demargination. -follow up by CBC  DVT ppx: SQ Heparin Code Status: Full code Family Communication:  Yes, patient's sister at bed side Disposition Plan: Admit to inpatient   Date of Service 05/31/2015    Ivor Costa [facility] Pager [phone]  If 7PM-7AM, please contact night-coverage [email or web address] Password [facility] 05/31/2015, 3:54 AM  Neuro Dr. Dr. Roosvelt Maser P 70 pulse 60. The most recent laboratories the [facility] shows

## 2015-06-01 DIAGNOSIS — E119 Type 2 diabetes mellitus without complications: Secondary | ICD-10-CM

## 2015-06-01 DIAGNOSIS — D72829 Elevated white blood cell count, unspecified: Secondary | ICD-10-CM

## 2015-06-01 DIAGNOSIS — Z794 Long term (current) use of insulin: Secondary | ICD-10-CM

## 2015-06-01 DIAGNOSIS — D649 Anemia, unspecified: Secondary | ICD-10-CM

## 2015-06-01 LAB — IRON AND TIBC
IRON: 15 ug/dL — AB (ref 28–170)
Saturation Ratios: 6 % — ABNORMAL LOW (ref 10.4–31.8)
TIBC: 245 ug/dL — AB (ref 250–450)
UIBC: 230 ug/dL

## 2015-06-01 LAB — COMPREHENSIVE METABOLIC PANEL
ALBUMIN: 3 g/dL — AB (ref 3.5–5.0)
ALK PHOS: 55 U/L (ref 38–126)
ALT: 10 U/L — AB (ref 14–54)
AST: 9 U/L — AB (ref 15–41)
Anion gap: 5 (ref 5–15)
BUN: 5 mg/dL — AB (ref 6–20)
CALCIUM: 8.5 mg/dL — AB (ref 8.9–10.3)
CO2: 26 mmol/L (ref 22–32)
CREATININE: 0.46 mg/dL (ref 0.44–1.00)
Chloride: 104 mmol/L (ref 101–111)
GFR calc non Af Amer: >60 mL/min (ref >60)
GLUCOSE: 163 mg/dL — AB (ref 65–99)
Potassium: 3.9 mmol/L (ref 3.5–5.1)
SODIUM: 135 mmol/L (ref 135–145)
Total Bilirubin: 0.3 mg/dL (ref 0.3–1.2)
Total Protein: 7 g/dL (ref 6.5–8.1)

## 2015-06-01 LAB — CBC
HCT: 30.9 % — ABNORMAL LOW (ref 36.0–46.0)
Hemoglobin: 10.2 g/dL — ABNORMAL LOW (ref 12.0–15.0)
MCH: 26.6 pg (ref 26.0–34.0)
MCHC: 33 g/dL (ref 30.0–36.0)
MCV: 80.7 fL (ref 78.0–100.0)
PLATELETS: 230 10*3/uL (ref 150–400)
RBC: 3.83 MIL/uL — AB (ref 3.87–5.11)
RDW: 14.3 % (ref 11.5–15.5)
WBC: 10.3 10*3/uL (ref 4.0–10.5)

## 2015-06-01 LAB — HEMOGLOBIN A1C
Hgb A1c MFr Bld: 8.3 % — ABNORMAL HIGH (ref 4.8–5.6)
MEAN PLASMA GLUCOSE: 192 mg/dL

## 2015-06-01 LAB — FERRITIN: Ferritin: 62 ng/mL (ref 11–307)

## 2015-06-01 LAB — GLUCOSE, CAPILLARY
Glucose-Capillary: 142 mg/dL — ABNORMAL HIGH (ref 65–99)
Glucose-Capillary: 193 mg/dL — ABNORMAL HIGH (ref 65–99)
Glucose-Capillary: 175 mg/dL — ABNORMAL HIGH (ref 65–99)
Glucose-Capillary: 163 mg/dL — ABNORMAL HIGH (ref 65–99)

## 2015-06-01 LAB — LIPASE, BLOOD: LIPASE: 71 U/L — AB (ref 11–51)

## 2015-06-01 LAB — IGG 4: IGG 4: 7 mg/dL (ref 1–291)

## 2015-06-01 LAB — HIV ANTIBODY (ROUTINE TESTING W REFLEX): HIV SCREEN 4TH GENERATION: NONREACTIVE

## 2015-06-01 MED ORDER — MAGNESIUM HYDROXIDE 400 MG/5ML PO SUSP
30.0000 mL | Freq: Every day | ORAL | Status: DC
  Administered 2015-06-02: 30 mL via ORAL
  Filled 2015-06-01: qty 30

## 2015-06-01 MED ORDER — SENNA 8.6 MG PO TABS
1.0000 | ORAL_TABLET | Freq: Every day | ORAL | Status: DC
  Administered 2015-06-01 – 2015-06-04 (×4): 8.6 mg via ORAL
  Filled 2015-06-01 (×4): qty 1

## 2015-06-01 MED ORDER — MAGNESIUM HYDROXIDE 400 MG/5ML PO SUSP: 15.0000 mL | Freq: Every day | ORAL | Status: DC

## 2015-06-01 NOTE — Progress Notes (Signed)
[facility] PROGRESS NOTE  Tara DRAVIS [id number] DOB: 06-08-1971 DOA: 05/31/2015 PCP: Maggie Font, MD    Code Status:  Full code Family Communication:  Family not available; discussed with patient Disposition Plan:  Discharged home when clinically appropriate, possibly in the next 24-48 hours   Consultants:   none  Procedures:   none  Antibiotics:   none  HPI/Subjective: Patient says that she has less abdominal pain. She rates the pain a 4-6 in intensity without pain medication. She has not had a bowel movement in several days. Her nausea has subsided. She is tolerating ice chips and sips of clear liquids.  Objective: Filed Vitals:   06/01/15 0632  BP: 136/80  Pulse: 90  Temp: 98.9 F (37.2 C)  Resp: 20   oxygen saturation 98% on room air  Intake/Output Summary (Last 24 hours) at 06/01/15 1249 Last data filed at 06/01/15 [id number]  Gross per 24 hour  Intake 2407.5 ml  Output      4 ml  Net 2403.5 ml   Filed Weights   05/31/15 0132 05/31/15 0553  Weight: 102.059 kg (225 lb) 102.876 kg (226 lb 12.8 oz)    Exam:   General:   Pleasant 44 year old African-American woman in no acute distress.  Cardiovascular:  S1, S2, with a soft systolic murmur.  Respiratory:  Decreased breath sounds in the bases, otherwise clear. Breathing is nonlabored.  Abdomen:  Positive bowel sounds , obese,  Mildly tender in the epigastrium without rebound guarding or distention.  Musculoskeletal /extremities: No pedal edema.   Data Reviewed: Basic Metabolic Panel:  Recent Labs Lab 05/29/15 1915 05/31/15 0155 05/31/15 0524 06/01/15 0544  NA 137 135 135 135  K 2.9* 3.9 4.2 3.9  CL 113* 101 103 104  CO2 20* 24 26 26   GLUCOSE 226* 200* 192* 163*  BUN 8 6 7  5*  CREATININE 0.37* 0.48 0.47 0.46  CALCIUM 7.2* 9.2 8.6* 8.5*   Liver Function Tests:  Recent Labs Lab 05/29/15 1915 05/31/15 0155 05/31/15 0524 06/01/15 0544  AST 13* 12* 9* 9*  ALT 11* 12* 12*  10*  ALKPHOS 54 68 59 55  BILITOT 0.2* 0.4 0.3 0.3  PROT 6.0* 8.0 7.2 7.0  ALBUMIN 2.8* 3.7 3.2* 3.0*    Recent Labs Lab 05/29/15 1915 05/31/15 0155 06/01/15 0544  LIPASE 117* 127* 71*   No results for input(s): AMMONIA in the last 168 hours. CBC:  Recent Labs Lab 05/29/15 1915 05/31/15 0155 05/31/15 0524 06/01/15 0544  WBC 11.2* 11.9* 9.7 10.3  NEUTROABS 8.5* 9.7*  --   --   HGB 10.9* 11.2* 10.2* 10.2*  HCT 33.2* 33.1* 31.2* 30.9*  MCV 80.8 79.6 81.0 80.7  PLT 252 226 225 230   Cardiac Enzymes: No results for input(s): CKTOTAL, CKMB, CKMBINDEX, TROPONINI in the last 168 hours. BNP (last 3 results) No results for input(s): BNP in the last 8760 hours.  ProBNP (last 3 results) No results for input(s): PROBNP in the last 8760 hours.  CBG:  Recent Labs Lab 05/31/15 1137 05/31/15 1639 05/31/15 2014 06/01/15 0745 06/01/15 1116  GLUCAP 142* 141* 162* 142* 193*    No results found for this or any previous visit (from the past 240 hour(s)).   Studies: No results found.  Scheduled Meds: . heparin  5,000 Units Subcutaneous 3 times per day  . insulin aspart  0-9 Units Subcutaneous TID WC  . insulin glargine  10 Units Subcutaneous QHS  . loratadine  10 mg Oral Daily  .  magnesium hydroxide  15 mL Oral Daily  . pantoprazole (PROTONIX) IV  40 mg Intravenous Q24H  . senna  1 tablet Oral QHS   Continuous Infusions: . sodium chloride 100 mL/hr at 06/01/15 0800   Assessment and plan:  Principal Problem:   Idiopathic acute pancreatitis without infection or necrosis Active Problems:   Diabetes type 2, uncontrolled ([facility])   Leukocytosis   Normocytic anemia    1. Acute pancreatitis , recurrent, likely idiopathic.  The patient has a history of pancreatitis in 2014. At that time, it was unclear what the etiology was as her abdominal ultrasound did not reveal gallstones and her triglyceride level was within normal limits. Nevertheless, ramipril was discontinued at  discharge as it has as one of its side effects -pancreatitis. Patient does not drink alcohol.  CT of her abdomen and pelvis revealed findings consistent with acute pancreatitis with soft tissue stranding about the mid pancreas on 05/29/15 during her ER visit then. It also revealed moderate volume of stool throughout her colon. Lipase on this admission was 127. -The patient was made to be virtually nothing by mouth with exception of ice chips. She was started on vigorous IV fluids , IV hydromorphone as needed , IV Protonix, and as needed IV Zofran. -Her pain has subsided some and she is tolerating sips and ice chips. Her lipase has improved, but not normalized yet. Her white blood cell count has normalized. We'll advance her diet to clear liquids as tolerated.  -She has not had a bowel movement in several days, so will add milk of magnesia and Senokot S. Patient was also encouraged to increase her ambulation.  -Etiology of her recurrent pancreatitis is unknown. She is improving, so will defer GI evaluation to the outpatient setting.   Type 2 diabetes mellitus. Patient is treated chronically with metformin and Lantus. Metformin is on hold. She was started on sliding scale NovoLog and Lantus. Because she was made to be virtually nothing by mouth on admission, the Lantus was decreased.  -We'll continue to monitor and adjust insulin as her intake improves. -Her hemoglobin A1c was 8.3.  Normocytic anemia.  The patient's hemoglobin was 11.2 on admission. With vigorous IV fluids, it has fallen slightly to 10.2. The patient denies black tarry stools or bright red blood per rectum. We'll order a total iron and ferritin. We'll continue to monitor.    Time spent:  25 minutes     Hospitalists Pager  [phone]. If 7PM-7AM, please contact night-coverage at [email or web address], password [facility] 06/01/2015, 12:49 PM  LOS: 1 day

## 2015-06-02 DIAGNOSIS — E119 Type 2 diabetes mellitus without complications: Secondary | ICD-10-CM

## 2015-06-02 DIAGNOSIS — Z794 Long term (current) use of insulin: Secondary | ICD-10-CM

## 2015-06-02 LAB — BASIC METABOLIC PANEL
Anion gap: 9 (ref 5–15)
CHLORIDE: 101 mmol/L (ref 101–111)
CO2: 26 mmol/L (ref 22–32)
CREATININE: 0.47 mg/dL (ref 0.44–1.00)
Calcium: 8.8 mg/dL — ABNORMAL LOW (ref 8.9–10.3)
GFR calc Af Amer: >60 mL/min (ref >60)
Glucose, Bld: 153 mg/dL — ABNORMAL HIGH (ref 65–99)
Potassium: 3.6 mmol/L (ref 3.5–5.1)
SODIUM: 136 mmol/L (ref 135–145)

## 2015-06-02 LAB — GLUCOSE, CAPILLARY
GLUCOSE-CAPILLARY: 149 mg/dL — AB (ref 65–99)
GLUCOSE-CAPILLARY: 133 mg/dL — AB (ref 65–99)
Glucose-Capillary: 135 mg/dL — ABNORMAL HIGH (ref 65–99)
Glucose-Capillary: 168 mg/dL — ABNORMAL HIGH (ref 65–99)

## 2015-06-02 LAB — FERRITIN: FERRITIN: 66 ng/mL (ref 11–307)

## 2015-06-02 LAB — IRON AND TIBC
Iron: 10 ug/dL — ABNORMAL LOW (ref 28–170)
SATURATION RATIOS: 4 % — AB (ref 10.4–31.8)
TIBC: 245 ug/dL — ABNORMAL LOW (ref 250–450)
UIBC: 235 ug/dL

## 2015-06-02 LAB — LIPASE, BLOOD: LIPASE: 69 U/L — AB (ref 11–51)

## 2015-06-02 MED ORDER — HYDROMORPHONE HCL 1 MG/ML IJ SOLN
0.5000 mg | INTRAMUSCULAR | Status: DC | PRN
  Administered 2015-06-02 – 2015-06-05 (×11): 0.5 mg via INTRAVENOUS
  Filled 2015-06-02 (×11): qty 1

## 2015-06-02 MED ORDER — POLYETHYLENE GLYCOL 3350 17 G PO PACK
17.0000 g | PACK | Freq: Two times a day (BID) | ORAL | Status: DC
  Administered 2015-06-02 – 2015-06-04 (×5): 17 g via ORAL
  Filled 2015-06-02 (×6): qty 1

## 2015-06-02 MED ORDER — POTASSIUM CHLORIDE IN NACL 20-0.9 MEQ/L-% IV SOLN
INTRAVENOUS | Status: DC
  Administered 2015-06-02: 50 mL/h via INTRAVENOUS
  Administered 2015-06-03 – 2015-06-05 (×3): via INTRAVENOUS

## 2015-06-02 MED ORDER — ALUM & MAG HYDROXIDE-SIMETH 200-200-20 MG/5ML PO SUSP
30.0000 mL | Freq: Three times a day (TID) | ORAL | Status: DC | PRN
  Filled 2015-06-02: qty 30

## 2015-06-02 NOTE — Progress Notes (Signed)
Patient has ambulated a couple of times today approx 250 ft without assistance.  She has been given the SSE and tolerated the procedure without any further complaints.  Mostly water resulted, but her abdomen looked less distended.  Patient is resting at this time and has no further complaints.

## 2015-06-02 NOTE — Progress Notes (Signed)
[facility] PROGRESS NOTE  Tara Kane [id number] DOB: March 06, 1971 DOA: 05/31/2015 PCP: Maggie Font, MD    Code Status:  Full code Family Communication:  Family not available; discussed with patient Disposition Plan:  Discharged home when clinically appropriate, possibly in the next 24-48 hours   Consultants:   none  Procedures:   none  Antibiotics:   none  HPI/Subjective: Patient says that she has less abdominal pain, but is still present. Her diet was advanced to full liquids which she appears to be tolerating well, but she reports still asking for pain medication every 3-4 hours. She has not had a bowel movement in several days.  Objective: Filed Vitals:   06/02/15 1409  BP: 133/71  Pulse: 92  Temp: 98.8 F (37.1 C)  Resp: 18   oxygen saturation 99% on room air  Intake/Output Summary (Last 24 hours) at 06/02/15 1613 Last data filed at 06/02/15 1411  Gross per 24 hour  Intake    480 ml  Output      0 ml  Net    480 ml   Filed Weights   05/31/15 0132 05/31/15 0553  Weight: 102.059 kg (225 lb) 102.876 kg (226 lb 12.8 oz)    Exam:   General:   Pleasant 44 year old African-American woman in no acute distress.  Cardiovascular:  S1, S2, with a soft systolic murmur.  Respiratory:  Decreased breath sounds in the bases, otherwise clear. Breathing is nonlabored.  Abdomen:  Positive bowel sounds , obese, scant tenderness in the epigastrium without rebound guarding; mild hypogastric distention query stool palpation.  Musculoskeletal /extremities: No pedal edema.   Data Reviewed: Basic Metabolic Panel:  Recent Labs Lab 05/29/15 1915 05/31/15 0155 05/31/15 0524 06/01/15 0544 06/02/15 0546  NA 137 135 135 135 136  K 2.9* 3.9 4.2 3.9 3.6  CL 113* 101 103 104 101  CO2 20* 24 26 26 26   GLUCOSE 226* 200* 192* 163* 153*  BUN 8 6 7  5* <5*  CREATININE 0.37* 0.48 0.47 0.46 0.47  CALCIUM 7.2* 9.2 8.6* 8.5* 8.8*   Liver Function  Tests:  Recent Labs Lab 05/29/15 1915 05/31/15 0155 05/31/15 0524 06/01/15 0544  AST 13* 12* 9* 9*  ALT 11* 12* 12* 10*  ALKPHOS 54 68 59 55  BILITOT 0.2* 0.4 0.3 0.3  PROT 6.0* 8.0 7.2 7.0  ALBUMIN 2.8* 3.7 3.2* 3.0*    Recent Labs Lab 05/29/15 1915 05/31/15 0155 06/01/15 0544 06/02/15 0546  LIPASE 117* 127* 71* 69*   No results for input(s): AMMONIA in the last 168 hours. CBC:  Recent Labs Lab 05/29/15 1915 05/31/15 0155 05/31/15 0524 06/01/15 0544  WBC 11.2* 11.9* 9.7 10.3  NEUTROABS 8.5* 9.7*  --   --   HGB 10.9* 11.2* 10.2* 10.2*  HCT 33.2* 33.1* 31.2* 30.9*  MCV 80.8 79.6 81.0 80.7  PLT 252 226 225 230   Cardiac Enzymes: No results for input(s): CKTOTAL, CKMB, CKMBINDEX, TROPONINI in the last 168 hours. BNP (last 3 results) No results for input(s): BNP in the last 8760 hours.  ProBNP (last 3 results) No results for input(s): PROBNP in the last 8760 hours.  CBG:  Recent Labs Lab 06/01/15 1116 06/01/15 1644 06/01/15 2105 06/02/15 0741 06/02/15 1142  GLUCAP 193* 175* 163* 149* 135*    No results found for this or any previous visit (from the past 240 hour(s)).   Studies: No results found.  Scheduled Meds: . heparin  5,000 Units Subcutaneous 3 times per day  .  insulin aspart  0-9 Units Subcutaneous TID WC  . insulin glargine  10 Units Subcutaneous QHS  . loratadine  10 mg Oral Daily  . pantoprazole (PROTONIX) IV  40 mg Intravenous Q24H  . polyethylene glycol  17 g Oral BID  . senna  1 tablet Oral QHS   Continuous Infusions: . sodium chloride 50 mL/hr (06/02/15 0917)   Assessment and plan:  Principal Problem:   Idiopathic acute pancreatitis without infection or necrosis Active Problems:   Leukocytosis   Normocytic anemia   Type 2 diabetes mellitus without complication, with long-term current use of insulin ([place])    1. Acute pancreatitis , recurrent, likely idiopathic.  The patient has a history of pancreatitis in 2014. At that  time, it was unclear what the etiology was as her abdominal ultrasound did not reveal gallstones and her triglyceride level was within normal limits. Nevertheless, ramipril was discontinued at discharge as it has as one of its side effects -pancreatitis. Patient does not drink alcohol.  CT of her abdomen and pelvis revealed findings consistent with acute pancreatitis with soft tissue stranding about the mid pancreas on 05/29/15 during her ER visit then. It also revealed moderate volume of stool throughout her colon. Lipase on this admission was 127. -The patient was made to be virtually nothing by mouth with exception of ice chips. She was started on vigorous IV fluids , IV hydromorphone as needed , IV Protonix, and as needed IV Zofran. -Her pain has subsided in the epigastrium, but I believe her pain is more from constipation at this point. Her lipase has improved, but not quite normalized yet. Her white blood cell count has normalized. Her diet was advanced to full liquids which she appears to be tolerating.  -Etiology of her recurrent pancreatitis is unknown. She is improving, so will defer GI evaluation to the outpatient setting.  Constipation. The patient reports not having a bowel movement in several days. CT scan of her abdomen/pelvis revealed moderate amount of stool throughout the colon. -Patient was given milk of magnesia and Senokot on 11/10, but with no bowel movement. -We will add MiraLAX twice daily. We'll try soapsuds enema. -The patient was encouraged to go longer before asking for IV hydromorphone. I will decrease the dose to 0.5 and increase the length of  frequency to every 4 hours, rather than every 3 hours. -Patient was instructed to ambulate more.  Type 2 diabetes mellitus. Patient is treated chronically with metformin and Lantus. Metformin is on hold. She was started on sliding scale NovoLog and Lantus. Because she was made to be virtually nothing by mouth on admission, the Lantus  was decreased.  -We'll continue to monitor and adjust insulin as her intake improves. -Her hemoglobin A1c was 8.3.  Normocytic anemia.  The patient's hemoglobin was 11.2 on admission. With vigorous IV fluids, it has fallen slightly to 10.2. The patient denies black tarry stools or bright red blood per rectum. Her total iron was low at 10 and her first and was 66. Will defer iron supplementation secondary to constipation. Recommend continued outpatient monitoring.     Time spent:  25 minutes    [facility] Pager  [phone]. If 7PM-7AM, please contact night-coverage at [email or web address], password [facility] 06/02/2015, 4:13 PM  LOS: 2 days

## 2015-06-03 ENCOUNTER — Inpatient Hospital Stay (HOSPITAL_COMMUNITY): Payer: PRIVATE HEALTH INSURANCE

## 2015-06-03 LAB — GLUCOSE, CAPILLARY
GLUCOSE-CAPILLARY: 167 mg/dL — AB (ref 65–99)
GLUCOSE-CAPILLARY: 143 mg/dL — AB (ref 65–99)
GLUCOSE-CAPILLARY: 184 mg/dL — AB (ref 65–99)
Glucose-Capillary: 178 mg/dL — ABNORMAL HIGH (ref 65–99)

## 2015-06-03 LAB — LIPASE, BLOOD: Lipase: 45 U/L (ref 11–51)

## 2015-06-03 MED ORDER — PANTOPRAZOLE SODIUM 40 MG PO TBEC
40.0000 mg | DELAYED_RELEASE_TABLET | Freq: Every day | ORAL | Status: DC
  Administered 2015-06-03 – 2015-06-05 (×3): 40 mg via ORAL
  Filled 2015-06-03 (×3): qty 1

## 2015-06-03 MED ORDER — MAGNESIUM CITRATE PO SOLN
1.0000 | Freq: Once | ORAL | Status: AC
  Administered 2015-06-03: 1 via ORAL
  Filled 2015-06-03: qty 296

## 2015-06-03 MED ORDER — BISACODYL 10 MG RE SUPP
10.0000 mg | Freq: Once | RECTAL | Status: AC
  Administered 2015-06-03: 10 mg via RECTAL
  Filled 2015-06-03: qty 1

## 2015-06-03 NOTE — Progress Notes (Signed)
[facility] PROGRESS NOTE  Tara Kane [id number] DOB: November 12, 1970 DOA: 05/31/2015 PCP: Maggie Font, MD    Code Status:  Full code Family Communication:  Family not available; discussed with patient Disposition Plan:  Discharged home when clinically appropriate, possibly in the next 24-48 hours   Consultants:   none  Procedures:   none  Antibiotics:   none  HPI/Subjective: (The patient was seen this morning and then reassessed). Patient's diet was advanced to a diabetic diet. Despite multiple suppositories and enema, and multiple laxatives, she is still not had a bowel movement. She has some lower abdominal pain, not consistent with her previous pancreatic pain. This afternoon, she had one episode of emesis after drinking magnesium citrate which she believes she drank too quickly. She denies coffee grounds emesis.  Objective: Filed Vitals:   06/03/15 1345  BP: 128/72  Pulse: 84  Temp: 98.3 F (36.8 C)  Resp: 18   oxygen saturation 100% on room air  Intake/Output Summary (Last 24 hours) at 06/03/15 1753 Last data filed at 06/03/15 1238  Gross per 24 hour  Intake    560 ml  Output      0 ml  Net    560 ml   Filed Weights   05/31/15 0132 05/31/15 0553  Weight: 102.059 kg (225 lb) 102.876 kg (226 lb 12.8 oz)    Exam:   General:   Pleasant 44 year old African-American woman in no acute distress.  Cardiovascular:  S1, S2, with a soft systolic murmur.  Respiratory:  Decreased breath sounds in the bases, otherwise clear. Breathing is nonlabored.  Abdomen:  Positive bowel sounds , obese, mild tenderness in the hypogastrium without rebound guarding; mild hypogastric distention query stool impaction.  Musculoskeletal /extremities: No pedal edema.   Data Reviewed: Basic Metabolic Panel:  Recent Labs Lab 05/29/15 1915 05/31/15 0155 05/31/15 0524 06/01/15 0544 06/02/15 0546  NA 137 135 135 135 136  K 2.9* 3.9 4.2 3.9 3.6  CL 113* 101 103  104 101  CO2 20* 24 26 26 26   GLUCOSE 226* 200* 192* 163* 153*  BUN 8 6 7  5* <5*  CREATININE 0.37* 0.48 0.47 0.46 0.47  CALCIUM 7.2* 9.2 8.6* 8.5* 8.8*   Liver Function Tests:  Recent Labs Lab 05/29/15 1915 05/31/15 0155 05/31/15 0524 06/01/15 0544  AST 13* 12* 9* 9*  ALT 11* 12* 12* 10*  ALKPHOS 54 68 59 55  BILITOT 0.2* 0.4 0.3 0.3  PROT 6.0* 8.0 7.2 7.0  ALBUMIN 2.8* 3.7 3.2* 3.0*    Recent Labs Lab 05/29/15 1915 05/31/15 0155 06/01/15 0544 06/02/15 0546 06/03/15 0620  LIPASE 117* 127* 71* 69* 45   No results for input(s): AMMONIA in the last 168 hours. CBC:  Recent Labs Lab 05/29/15 1915 05/31/15 0155 05/31/15 0524 06/01/15 0544  WBC 11.2* 11.9* 9.7 10.3  NEUTROABS 8.5* 9.7*  --   --   HGB 10.9* 11.2* 10.2* 10.2*  HCT 33.2* 33.1* 31.2* 30.9*  MCV 80.8 79.6 81.0 80.7  PLT 252 226 225 230   Cardiac Enzymes: No results for input(s): CKTOTAL, CKMB, CKMBINDEX, TROPONINI in the last 168 hours. BNP (last 3 results) No results for input(s): BNP in the last 8760 hours.  ProBNP (last 3 results) No results for input(s): PROBNP in the last 8760 hours.  CBG:  Recent Labs Lab 06/02/15 1718 06/02/15 2047 06/03/15 0744 06/03/15 1129 06/03/15 1640  GLUCAP 133* 168* 178* 167* 143*    No results found for this or any previous  visit (from the past 240 hour(s)).   Studies: No results found.  Scheduled Meds: . heparin  5,000 Units Subcutaneous 3 times per day  . insulin aspart  0-9 Units Subcutaneous TID WC  . insulin glargine  10 Units Subcutaneous QHS  . loratadine  10 mg Oral Daily  . pantoprazole  40 mg Oral Daily  . polyethylene glycol  17 g Oral BID  . senna  1 tablet Oral QHS   Continuous Infusions: . 0.9 % NaCl with KCl 20 mEq / L 50 mL/hr at 06/03/15 1406   Assessment and plan:  Principal Problem:   Idiopathic acute pancreatitis without infection or necrosis Active Problems:   Leukocytosis   Normocytic anemia   Type 2 diabetes  mellitus without complication, with long-term current use of insulin ([place])    1. Acute pancreatitis , recurrent, likely idiopathic.  The patient has a history of pancreatitis in 2014. At that time, it was unclear what the etiology was as her abdominal ultrasound did not reveal gallstones and her triglyceride level was within normal limits. Nevertheless, ramipril was discontinued at discharge as it has as one of its side effects -pancreatitis. Patient does not drink alcohol.  CT of her abdomen and pelvis revealed findings consistent with acute pancreatitis with soft tissue stranding about the mid pancreas on 05/29/15 during her ER visit then. It also revealed moderate volume of stool throughout her colon. Lipase on this admission was 127. -The patient was made to be virtually nothing by mouth with exception of ice chips. She was started on vigorous IV fluids , IV hydromorphone as needed , IV Protonix, and as needed IV Zofran. -Her pain has subsided in the epigastrium, but I believe her pain is more from constipation at this point. Her lipase has normalized. Her white blood cell count has normalized. Her diet was advanced to full liquids which she tolerated, then her diet was advanced to a carbohydrate modified diet. She was tolerating it until late this afternoon when she had an episode of emesis following drinking magnesium citrate.  -Etiology of her recurrent pancreatitis is unknown. She is improving, so will defer GI evaluation to the outpatient setting.  Constipation. The patient reports not having a bowel movement in several days. CT scan of her abdomen/pelvis revealed moderate amount of stool throughout the colon. -Patient was given milk of magnesia and Senokot on 11/10, but with no bowel movement. -We will add MiraLAX twice daily. Soapsuds enema was given followed by 2 Dulcolax suppositories. The patient had only a small hard bowel movement. Therefore, magnesium citrate was ordered. She drinks 8  ounce bottle rather quickly. She did have an episode of emesis which may have been secondary to drinking the magnesium citrate to quickly. -We'll continue laxative therapy. We'll continue Zofran as needed and PPI. -We'll order a KUB to rule out obstruction and/or ileus. -Patient was again instructed to ambulate as tolerated and to try to go longer between request for IV hydromorphone. -We'll downgrade her diet again to full liquids.  Type 2 diabetes mellitus. Patient is treated chronically with metformin and Lantus. Metformin is on hold. She was started on sliding scale NovoLog and Lantus. Because she was made to be virtually nothing by mouth on admission, the Lantus was decreased.  -We'll continue to monitor and adjust insulin as her intake improves. -Her hemoglobin A1c was 8.3.  Normocytic anemia.  The patient's hemoglobin was 11.2 on admission. With vigorous IV fluids, it has fallen slightly to 10.2. The patient  denies black tarry stools or bright red blood per rectum. Her total iron was low at 10 and her first and was 66. Will defer iron supplementation secondary to constipation. Recommend continued outpatient monitoring.     Time spent:  25 minutes    [facility] Pager  [phone]. If 7PM-7AM, please contact night-coverage at [email or web address], password [facility] 06/03/2015, 5:53 PM  LOS: 3 days

## 2015-06-03 NOTE — Discharge Summary (Signed)
Physician Discharge Summary  Tara Kane [id number] DOB: May 19, 1971 DOA: 05/31/2015  PCP: Maggie Font, MD  Admit date: 05/31/2015 Discharge date: 06/05/2015  Time spent: GREATER THAN 30 minutes  Recommendations for Outpatient Follow-up:  1. Patient was given a new patient appointment with [facility] prior to hospital discharge for follow-up of recurrent pancreatitis.     Discharge Diagnoses:  1. 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 2. Constipation with ileus. 3. Iron deficiency anemia. 4. Type 2 diabetes mellitus without complication. 5. Leukocytosis, secondary to pancreatitis. Resolved.   Discharge Condition: Improved.  Diet recommendation: Carbohydrate modified/low fat.  Filed Weights   05/31/15 0132 05/31/15 0553  Weight: 102.059 kg (225 lb) 102.876 kg (226 lb 12.8 oz)    History of present illness:  The patient is a 44 year old woman with a history of diabetes mellitus and pancreatitis in 2004, who presented to the emergency department on 05/31/2015 with a complaint of abdominal pain and nausea. She actually presented to the ED 05/29/2015 and was found to have an elevated lipase and a CT scan that showed mild pancreatitis without complications. She was admitted for further evaluation and management.  Hospital Course:   1. Acute pancreatitis , recurrent, likely idiopathic. The patient has a history of pancreatitis in 2014. At that time, it was unclear what the etiology was as her abdominal ultrasound did not reveal gallstones and her triglyceride level was within normal limits. Nevertheless, ramipril was discontinued at discharge as it had as one of its side effects -pancreatitis. Patient does not drink alcohol. CT of her abdomen and pelvis revealed findings consistent with acute pancreatitis with soft tissue stranding about the mid pancreas on 05/29/15 during her ER visit then. It also revealed moderate  volume of stool throughout her colon. Lipase on this admission was 127. -The patient was made to be virtually nothing by mouth with exception of ice chips. She was started on vigorous IV fluids , IV hydromorphone as needed , IV Protonix, and as needed IV Zofran. -Her pain  subsided in the epigastrium. Her lipase normalized. Her white blood cell count normalized. Her diet was advanced which she tolerated well. It was felt that her residual pain was from constipation. -Etiology of her recurrent pancreatitis was unknown; it could be idiopathic. An inpatient gastroenterology consult was not requested as the patient improved and did not have complicating features. However, a new patient appointment was made for her to follow-up with [facility] for further recommendations and management. Constipation and ileus. The patient reported not having a bowel movement in several days. CT scan of her abdomen/pelvis revealed moderate amount of stool throughout the colon. -Patient was given milk of magnesia and Senokot on 11/10, but with no bowel movement. MiraLAX was added twice a day. Dulcolax suppository was given twice. The frequency and dose of IV hydromorphone were decreased. Patient was encouraged to ambulate. Following these measures, she had a very small bowel movement. She complained of abdominal fullness, so a KUB was ordered. The findings were suggestive of an ileus. For further treatment, she was given 2 bottles of magnesium citrate followed by a milk and molasses enema. She finally had 2 large bowel movements. -At the time of discharge, she was asymptomatic. She was instructed to take MiraLAX twice daily as needed. Type 2 diabetes mellitus. Patient is treated chronically with metformin and Lantus. Metformin was held during hospital course. She was started on sliding scale NovoLog and Lantus. Because she was made to be virtually nothing  by mouth on admission, the Lantus was decreased.  -Her  hemoglobin A1c was 8.3. -She was instructed to resume metformin and her home dose of Lantus, without changes. The patient is followed by endocrinologist, Dr. Bubba Camp in [place]. The patient would prefer to discuss any changes in her regimen with her. Normocytic anemia. The patient's hemoglobin was 11.2 on admission. With vigorous IV fluids, it fell to 10.2. The patient denied black tarry stools or bright red blood per rectum. She still has regular menstrual periods. Her total iron was low at 10 and her ferritin was 66. Iron supplementation was deferred secondary to her constipation. Recommend continued outpatient monitoring.   Procedures:  None  Consultations:  None      Discharge Exam: Filed Vitals:   06/05/15 1248  BP: 125/68  Pulse: 87  Temp: 98.6 F (37 C)  Resp: 18    General: Pleasant 44 year old African-American woman in no acute distress.  Cardiovascular: S1, S2, with a soft systolic murmur.  Respiratory: Decreased breath sounds in the bases, otherwise clear. Breathing is nonlabored.  Abdomen: Positive bowel sounds , obese, scant tenderness in the hypogastrium without rebound or guarding.  Musculoskeletal /extremities: No pedal edema.  Discharge Instructions   Discharge Instructions    Diet - low sodium heart healthy    Complete by:  As directed      Diet Carb Modified    Complete by:  As directed      Increase activity slowly    Complete by:  As directed           Current Discharge Medication List    START taking these medications   Details  polyethylene glycol (MIRALAX / GLYCOLAX) packet Take 17 g by mouth 2 (two) times daily as needed.      CONTINUE these medications which have CHANGED   Details  ondansetron (ZOFRAN) 4 MG tablet Take 1 tablet (4 mg total) by mouth every 8 (eight) hours as needed for nausea or vomiting. Qty: 20 tablet, Refills: 0    oxyCODONE-acetaminophen (PERCOCET) 5-325 MG tablet Take 1 tablet by mouth every 4  (four) hours as needed. Qty: 20 tablet, Refills: 0      CONTINUE these medications which have NOT CHANGED   Details  fexofenadine (ALLEGRA) 180 MG tablet Take 180 mg by mouth daily.    Insulin Glargine (LANTUS SOLOSTAR) 100 UNIT/ML Solostar Pen Inject 30 Units into the skin at bedtime.    metFORMIN (GLUCOPHAGE-XR) 500 MG 24 hr tablet Take 1,000 mg by mouth 2 (two) times daily.     Multiple Vitamin (MULTIVITAMIN) tablet Take 1 tablet by mouth daily.    acetaminophen (TYLENOL) 500 MG tablet Take 500 mg by mouth every 6 (six) hours as needed for mild pain or moderate pain.       Allergies  Allergen Reactions  . Aspirin Anaphylaxis and Swelling  . Ibuprofen Anaphylaxis and Swelling  . Latex Swelling    Facial   . Other Swelling    Muscle Relaxers Cause Swelling.    Follow-up Information    Follow up with Laban Emperor, NP On 07/19/2015.   Specialty:  Gastroenterology   Why:  at 10:00 am   Contact information:   [address] [place] [address] [phone]       Follow up with Maggie Font, MD On 06/13/2015.   Specialty:  Family Medicine   Why:  at 3:15 pm   Contact information:   [address] [place] [address] [phone]  The results of significant diagnostics from this hospitalization (including imaging, microbiology, ancillary and laboratory) are listed below for reference.    Significant Diagnostic Studies: Dg Abd 1 View  06/03/2015  CLINICAL DATA:  Constipation, pancreatitis. EXAM: ABDOMEN - 1 VIEW COMPARISON:  None. FINDINGS: Oral contrast material is seen within the mildly distended right colon and transverse colon. Mildly distended gas-filled loops of bowel are noted within the left abdomen. Overall, findings suggest ileus related to patient's recently diagnosed pancreatitis. No mass or abnormal fluid collection identified. No evidence of free intraperitoneal air. No pathologic- appearing calcifications seen. No acute osseous abnormality.  IMPRESSION: Probable ileus related to recently diagnosed pancreatitis. Small bowel obstruction less likely as the oral contrast material from the recent CT is present within the colon. Recommend serial follow-up plain film examinations of the abdomen to exclude an early developing small bowel obstruction. Electronically Signed   By: Franki Cabot M.D.   On: 06/03/2015 18:54   Ct Abdomen Pelvis W Contrast  05/29/2015  CLINICAL DATA:  Right flank and upper quadrant pain for 2 days. Epigastric pain. History of pancreatitis. EXAM: CT ABDOMEN AND PELVIS WITH CONTRAST TECHNIQUE: Multidetector CT imaging of the abdomen and pelvis was performed using the standard protocol following bolus administration of intravenous contrast. CONTRAST:  153mL OMNIPAQUE IOHEXOL 300 MG/ML SOLN, 71mL OMNIPAQUE IOHEXOL 300 MG/ML SOLN COMPARISON:  None. FINDINGS: Lower chest:  The included lung bases are clear. Liver: Mild decreased density suggestive of steatosis. No focal lesion. Hepatobiliary: Gallbladder physiologically distended. No calcified stone. No biliary dilatation. Pancreas: Mild stranding about the peripancreatic fat about the body of the pancreas consistent with pancreatitis. No ductal dilatation. No peripancreatic fluid collection. Homogeneous enhancement of the pancreatic parenchyma. The splenic vein is patent. Spleen: Normal.  Small splenule anteriorly. Adrenal glands: No nodule. Kidneys: Symmetric renal enhancement and excretion. No hydronephrosis. Stomach/Bowel: Stomach physiologically distended. There are no dilated or thickened small bowel loops. Moderate volume of stool throughout the colon without colonic wall thickening. The appendix is normal. Vascular/Lymphatic: No retroperitoneal adenopathy. Abdominal aorta is normal in caliber. Reproductive: Uterus anteverted and normal in size. Ovaries symmetric in size. Bladder: Ni completely decompressed and not well evaluated. Other: No free air, free fluid, or intra-abdominal  fluid collection. Musculoskeletal: There are no acute or suspicious osseous abnormalities. IMPRESSION: Findings consistent with mild acute pancreatitis with soft tissue stranding about the mid pancreas. No pancreatitis complications identified. Electronically Signed   By: Jeb Levering M.D.   On: 05/29/2015 20:59    Microbiology: No results found for this or any previous visit (from the past 240 hour(s)).   Labs: Basic Metabolic Panel:  Recent Labs Lab 05/31/15 0155 05/31/15 0524 06/01/15 0544 06/02/15 0546 06/04/15 0610  NA 135 135 135 136 135  K 3.9 4.2 3.9 3.6 3.7  CL 101 103 104 101 104  CO2 24 26 26 26 26   GLUCOSE 200* 192* 163* 153* 175*  BUN 6 7 5* <5* 6  CREATININE 0.48 0.47 0.46 0.47 0.44  CALCIUM 9.2 8.6* 8.5* 8.8* 8.6*   Liver Function Tests:  Recent Labs Lab 05/29/15 1915 05/31/15 0155 05/31/15 0524 06/01/15 0544 06/04/15 0610  AST 13* 12* 9* 9* 15  ALT 11* 12* 12* 10* 15  ALKPHOS 54 68 59 55 66  BILITOT 0.2* 0.4 0.3 0.3 0.3  PROT 6.0* 8.0 7.2 7.0 7.1  ALBUMIN 2.8* 3.7 3.2* 3.0* 3.0*    Recent Labs Lab 05/31/15 0155 06/01/15 0544 06/02/15 0546 06/03/15 0620 06/04/15 0610  LIPASE  127* 71* 69* 45 36   No results for input(s): AMMONIA in the last 168 hours. CBC:  Recent Labs Lab 05/29/15 1915 05/31/15 0155 05/31/15 0524 06/01/15 0544 06/04/15 0610  WBC 11.2* 11.9* 9.7 10.3 8.6  NEUTROABS 8.5* 9.7*  --   --   --   HGB 10.9* 11.2* 10.2* 10.2* 10.1*  HCT 33.2* 33.1* 31.2* 30.9* 30.3*  MCV 80.8 79.6 81.0 80.7 80.4  PLT 252 226 225 230 240   Cardiac Enzymes: No results for input(s): CKTOTAL, CKMB, CKMBINDEX, TROPONINI in the last 168 hours. BNP: BNP (last 3 results) No results for input(s): BNP in the last 8760 hours.  ProBNP (last 3 results) No results for input(s): PROBNP in the last 8760 hours.  CBG:  Recent Labs Lab 06/04/15 1107 06/04/15 1559 06/04/15 2008 06/05/15 0740 06/05/15 1140  GLUCAP 165* 170* 141* 133* 162*        Signed:  Sofia Vanmeter  [facility] 06/05/2015, 3:19 PM

## 2015-06-04 DIAGNOSIS — K567 Ileus, unspecified: Secondary | ICD-10-CM

## 2015-06-04 DIAGNOSIS — K5909 Other constipation: Secondary | ICD-10-CM

## 2015-06-04 LAB — GLUCOSE, CAPILLARY
GLUCOSE-CAPILLARY: 170 mg/dL — AB (ref 65–99)
GLUCOSE-CAPILLARY: 141 mg/dL — AB (ref 65–99)
Glucose-Capillary: 143 mg/dL — ABNORMAL HIGH (ref 65–99)
Glucose-Capillary: 165 mg/dL — ABNORMAL HIGH (ref 65–99)

## 2015-06-04 LAB — COMPREHENSIVE METABOLIC PANEL
ALBUMIN: 3 g/dL — AB (ref 3.5–5.0)
ALT: 15 U/L (ref 14–54)
AST: 15 U/L (ref 15–41)
Alkaline Phosphatase: 66 U/L (ref 38–126)
Anion gap: 5 (ref 5–15)
BUN: 6 mg/dL (ref 6–20)
CHLORIDE: 104 mmol/L (ref 101–111)
CO2: 26 mmol/L (ref 22–32)
CREATININE: 0.44 mg/dL (ref 0.44–1.00)
Calcium: 8.6 mg/dL — ABNORMAL LOW (ref 8.9–10.3)
GFR calc Af Amer: >60 mL/min (ref >60)
GFR calc non Af Amer: >60 mL/min (ref >60)
Glucose, Bld: 175 mg/dL — ABNORMAL HIGH (ref 65–99)
POTASSIUM: 3.7 mmol/L (ref 3.5–5.1)
SODIUM: 135 mmol/L (ref 135–145)
Total Bilirubin: 0.3 mg/dL (ref 0.3–1.2)
Total Protein: 7.1 g/dL (ref 6.5–8.1)

## 2015-06-04 LAB — CBC
HEMATOCRIT: 30.3 % — AB (ref 36.0–46.0)
Hemoglobin: 10.1 g/dL — ABNORMAL LOW (ref 12.0–15.0)
MCH: 26.8 pg (ref 26.0–34.0)
MCHC: 33.3 g/dL (ref 30.0–36.0)
MCV: 80.4 fL (ref 78.0–100.0)
PLATELETS: 240 10*3/uL (ref 150–400)
RBC: 3.77 MIL/uL — AB (ref 3.87–5.11)
RDW: 14.5 % (ref 11.5–15.5)
WBC: 8.6 10*3/uL (ref 4.0–10.5)

## 2015-06-04 LAB — LIPASE, BLOOD: Lipase: 36 U/L (ref 11–51)

## 2015-06-04 MED ORDER — MAGNESIUM CITRATE PO SOLN
1.0000 | Freq: Once | ORAL | Status: AC
  Administered 2015-06-04: 1 via ORAL
  Filled 2015-06-04: qty 296

## 2015-06-04 MED ORDER — ACETAMINOPHEN 325 MG PO TABS: 650.0000 mg | ORAL_TABLET | Freq: Four times a day (QID) | ORAL | Status: DC | PRN

## 2015-06-04 NOTE — Progress Notes (Signed)
Pt c/o headache.  Paged Dr Caryn Section to see if other PRN can be given besides dilaudid.  Will continue to monitor.

## 2015-06-04 NOTE — Progress Notes (Signed)
[facility] PROGRESS NOTE  Tara Kane [id number] DOB: 07/29/1970 DOA: 05/31/2015 PCP: Maggie Font, MD    Code Status:  Full code Family Communication:  Family not available; discussed with patient Disposition Plan:  Discharged home when clinically appropriate, possibly in  24-48 hours   Consultants:   none  Procedures:   none  Antibiotics:   none  HPI/Subjective: Patient has not had a bowel movement yet. She is tolerating her full liquid diet without nausea or vomiting. Her abdominal pain is mild.  Objective: Filed Vitals:   06/04/15 0516  BP: 125/77  Pulse: 78  Temp: 98.2 F (36.8 C)  Resp: 17   oxygen saturation 97 percent on room air  Intake/Output Summary (Last 24 hours) at 06/04/15 1331 Last data filed at 06/04/15 0800  Gross per 24 hour  Intake   2510 ml  Output      0 ml  Net   2510 ml   Filed Weights   05/31/15 0132 05/31/15 0553  Weight: 102.059 kg (225 lb) 102.876 kg (226 lb 12.8 oz)    Exam:   General:   Pleasant 44 year old African-American woman in no acute distress.  Cardiovascular:  S1, S2, with a soft systolic murmur.  Respiratory:  Decreased breath sounds in the bases, otherwise clear. Breathing is nonlabored.  Abdomen:  Positive bowel sounds , obese, mild tenderness in the hypogastrium without rebound guarding; mild hypogastric distention query stool impaction.  Musculoskeletal /extremities: No pedal edema.   Data Reviewed: Basic Metabolic Panel:  Recent Labs Lab 05/31/15 0155 05/31/15 0524 06/01/15 0544 06/02/15 0546 06/04/15 0610  NA 135 135 135 136 135  K 3.9 4.2 3.9 3.6 3.7  CL 101 103 104 101 104  CO2 24 26 26 26 26   GLUCOSE 200* 192* 163* 153* 175*  BUN 6 7 5* <5* 6  CREATININE 0.48 0.47 0.46 0.47 0.44  CALCIUM 9.2 8.6* 8.5* 8.8* 8.6*   Liver Function Tests:  Recent Labs Lab 05/29/15 1915 05/31/15 0155 05/31/15 0524 06/01/15 0544 06/04/15 0610  AST 13* 12* 9* 9* 15  ALT 11* 12* 12*  10* 15  ALKPHOS 54 68 59 55 66  BILITOT 0.2* 0.4 0.3 0.3 0.3  PROT 6.0* 8.0 7.2 7.0 7.1  ALBUMIN 2.8* 3.7 3.2* 3.0* 3.0*    Recent Labs Lab 05/31/15 0155 06/01/15 0544 06/02/15 0546 06/03/15 0620 06/04/15 0610  LIPASE 127* 71* 69* 45 36   No results for input(s): AMMONIA in the last 168 hours. CBC:  Recent Labs Lab 05/29/15 1915 05/31/15 0155 05/31/15 0524 06/01/15 0544 06/04/15 0610  WBC 11.2* 11.9* 9.7 10.3 8.6  NEUTROABS 8.5* 9.7*  --   --   --   HGB 10.9* 11.2* 10.2* 10.2* 10.1*  HCT 33.2* 33.1* 31.2* 30.9* 30.3*  MCV 80.8 79.6 81.0 80.7 80.4  PLT 252 226 225 230 240   Cardiac Enzymes: No results for input(s): CKTOTAL, CKMB, CKMBINDEX, TROPONINI in the last 168 hours. BNP (last 3 results) No results for input(s): BNP in the last 8760 hours.  ProBNP (last 3 results) No results for input(s): PROBNP in the last 8760 hours.  CBG:  Recent Labs Lab 06/03/15 1129 06/03/15 1640 06/03/15 2112 06/04/15 0737 06/04/15 1107  GLUCAP 167* 143* 184* 143* 165*    No results found for this or any previous visit (from the past 240 hour(s)).   Studies: Dg Abd 1 View  06/03/2015  CLINICAL DATA:  Constipation, pancreatitis. EXAM: ABDOMEN - 1 VIEW COMPARISON:  None. FINDINGS:  Oral contrast material is seen within the mildly distended right colon and transverse colon. Mildly distended gas-filled loops of bowel are noted within the left abdomen. Overall, findings suggest ileus related to patient's recently diagnosed pancreatitis. No mass or abnormal fluid collection identified. No evidence of free intraperitoneal air. No pathologic- appearing calcifications seen. No acute osseous abnormality. IMPRESSION: Probable ileus related to recently diagnosed pancreatitis. Small bowel obstruction less likely as the oral contrast material from the recent CT is present within the colon. Recommend serial follow-up plain film examinations of the abdomen to exclude an early developing small  bowel obstruction. Electronically Signed   By: Franki Cabot M.D.   On: 06/03/2015 18:54    Scheduled Meds: . heparin  5,000 Units Subcutaneous 3 times per day  . insulin aspart  0-9 Units Subcutaneous TID WC  . insulin glargine  10 Units Subcutaneous QHS  . loratadine  10 mg Oral Daily  . pantoprazole  40 mg Oral Daily  . polyethylene glycol  17 g Oral BID  . senna  1 tablet Oral QHS   Continuous Infusions: . 0.9 % NaCl with KCl 20 mEq / L 50 mL/hr at 06/04/15 0516   Assessment and plan:  Principal Problem:   Idiopathic acute pancreatitis without infection or necrosis Active Problems:   Constipation   Ileus, unspecified (HCC)   Leukocytosis   Normocytic anemia   Type 2 diabetes mellitus without complication, with long-term current use of insulin ([place])    1. Acute pancreatitis , recurrent, likely idiopathic.  The patient has a history of pancreatitis in 2014. At that time, it was unclear what the etiology was as her abdominal ultrasound did not reveal gallstones and her triglyceride level was within normal limits. Nevertheless, ramipril was discontinued at discharge as it has as one of its side effects -pancreatitis. Patient does not drink alcohol.  CT of her abdomen and pelvis revealed findings consistent with acute pancreatitis with soft tissue stranding about the mid pancreas on 05/29/15 during her ER visit then. It also revealed moderate volume of stool throughout her colon. Lipase on this admission was 127. -The patient was made to be virtually nothing by mouth with exception of ice chips. She was started on vigorous IV fluids , IV hydromorphone as needed , IV Protonix, and as needed IV Zofran. -Her pain has subsided in the epigastrium, but it appears her pain is mostly from constipation at this point. Her lipase has normalized. Her white blood cell count has normalized. Her diet was advanced to full liquids which she tolerated, then her diet was advanced to a carbohydrate modified  diet. She was tolerating it she had an episode of nausea and vomiting which she attributes to drinking the magnesium citrate to quickly.  -Etiology of her recurrent pancreatitis is unknown. She is improving, so will defer GI evaluation to the outpatient setting.  Constipation. The patient reports not having a bowel movement in several days. CT scan of her abdomen/pelvis revealed moderate amount of stool throughout the colon. -Patient was given milk of magnesia and Senokot on 11/10, but with no bowel movement. MiraLAX twice daily was added. She was given soapsuds enema 1 and 2 duck like suppositories. It resulted in one small hard stool/bowel movement. -She was given 8 ounces magnesium citrate. She admitted drinking it too quickly and had emesis with vomiting up half of the solution. -She was encouraged to ambulate as much as tolerated and to decrease her request for IV hydromorphone. -KUB was ordered on  11/12 and revealed an ileus, but no outright small bowel obstruction. -Her diet was downgraded to full liquids which she is tolerating. -We'll try another bottle of magnesium citrate with the instruction to sip it slowly. -Chewing gum was offered for her to chew as often as possible. -We'll continue PPI and as needed Zofran.  Type 2 diabetes mellitus. Patient is treated chronically with metformin and Lantus. Metformin is on hold. She was started on sliding scale NovoLog and Lantus. Because she was made to be virtually nothing by mouth on admission, the Lantus was decreased.  -We'll continue to monitor and adjust insulin as her intake improves. -Her hemoglobin A1c was 8.3.  Normocytic anemia.  The patient's hemoglobin was 11.2 on admission. With vigorous IV fluids, it has fallen slightly to 10.2. The patient denies black tarry stools or bright red blood per rectum. Her total iron was low at 10 and her first and was 66. Will defer iron supplementation secondary to constipation. Recommend  continued outpatient monitoring.     Time spent:  25 minutes    [facility] Pager  [phone]. If 7PM-7AM, please contact night-coverage at [email or web address], password [facility] 06/04/2015, 1:31 PM  LOS: 4 days

## 2015-06-05 DIAGNOSIS — E669 Obesity, unspecified: Secondary | ICD-10-CM

## 2015-06-05 DIAGNOSIS — E871 Hypo-osmolality and hyponatremia: Secondary | ICD-10-CM

## 2015-06-05 LAB — GLUCOSE, CAPILLARY
Glucose-Capillary: 133 mg/dL — ABNORMAL HIGH (ref 65–99)
Glucose-Capillary: 162 mg/dL — ABNORMAL HIGH (ref 65–99)

## 2015-06-05 MED ORDER — POLYETHYLENE GLYCOL 3350 17 G PO PACK: 17.0000 g | PACK | Freq: Two times a day (BID) | ORAL | Status: DC | PRN

## 2015-06-05 MED ORDER — ONDANSETRON HCL 4 MG PO TABS: 4.0000 mg | ORAL_TABLET | Freq: Three times a day (TID) | ORAL | Status: DC | PRN

## 2015-06-05 MED ORDER — OXYCODONE-ACETAMINOPHEN 5-325 MG PO TABS: 1.0000 | ORAL_TABLET | ORAL | Status: DC | PRN

## 2015-06-05 MED ORDER — MILK AND MOLASSES ENEMA
1.0000 | Freq: Once | RECTAL | Status: AC
  Administered 2015-06-05: 250 mL via RECTAL

## 2015-06-05 NOTE — Final Progress Note (Signed)
Patient discharged with instructions, prescription, and care notes.  Verbalized understanding via teach back.  IV was removed and the site was WNL. Patient voiced no further complaints or concerns at the time of discharge.  Appointments scheduled per instructions.  Patient left the floor via w/c with staff and family in stable condition.  Late entry:  Dr. Caryn Section was made aware of the patient had an large BM prior to the milk and molasses enema and one after.  She tolerated her meals today without any difficulty.  She had no complaints.

## 2015-06-05 NOTE — Care Management Note (Signed)
Case Management Note  Patient Details  Name: Tara Kane MRN: [id number] Date of Birth: 1970-11-29  Subjective/Objective:                    Action/Plan:   Expected Discharge Date:                  Expected Discharge Plan:  Home/Self Care  In-House Referral:  NA  Discharge planning Services  CM Consult  Post Acute Care Choice:  NA Choice offered to:  NA  DME Arranged:    DME Agency:     HH Arranged:    HH Agency:     Status of Service:  Completed, signed off  Medicare Important Message Given:    Date Medicare IM Given:    Medicare IM give by:    Date Additional Medicare IM Given:    Additional Medicare Important Message give by:     If discussed at [place] of Stay Meetings, dates discussed:    Additional Comments: Pt discharged home today. No CM needs noted. Christinia Gully Fraser, RN 06/05/2015, 3:02 PM

## 2015-07-19 ENCOUNTER — Other Ambulatory Visit: Payer: Self-pay

## 2015-07-19 ENCOUNTER — Encounter: Payer: Self-pay | Admitting: Gastroenterology

## 2015-07-19 ENCOUNTER — Ambulatory Visit (INDEPENDENT_AMBULATORY_CARE_PROVIDER_SITE_OTHER): Payer: PRIVATE HEALTH INSURANCE | Admitting: Gastroenterology

## 2015-07-19 VITALS — BP 121/76 | HR 91 | Temp 97.6°F | Ht 71.0 in | Wt 226.8 lb

## 2015-07-19 DIAGNOSIS — D649 Anemia, unspecified: Secondary | ICD-10-CM

## 2015-07-19 DIAGNOSIS — K8500 Idiopathic acute pancreatitis without necrosis or infection: Secondary | ICD-10-CM | POA: Diagnosis not present

## 2015-07-19 DIAGNOSIS — D509 Iron deficiency anemia, unspecified: Secondary | ICD-10-CM

## 2015-07-19 DIAGNOSIS — K859 Acute pancreatitis, unspecified: Secondary | ICD-10-CM

## 2015-07-19 MED ORDER — PEG 3350-KCL-NA BICARB-NACL 420 G PO SOLR: 4000.0000 mL | Freq: Once | ORAL | Status: DC

## 2015-07-19 NOTE — Progress Notes (Signed)
Primary Care Physician:  Maggie Font, MD Primary Gastroenterologist:  Dr. Gala Romney   Chief Complaint  Patient presents with  . Follow-up    HPI:   Tara Kane is a 44 y.o. female presenting today at the request of [facility] hospitalist service due to recent admission for recurrent pancreatitis. She had a bump in her lipase in 2014 and was inpatient. At that time, LFTs were normal and lipid panel unimpressive. [place] of abdomen without gallstones at that time. It was felt that ramipril may have been the culprit. She presented again to the ED in Nov 2016 with mild, uncomplicated pancreatitis and CT with mild acute pancreatitis. Again, her LFTs were normal, lipid panel normal. Her labs showed anemia, with low iron but normal ferritin.    Presents today without any further abdominal pain. States she has a cousin who suffers from pancreatitis. Denies any ETOH use. Only drinks diet coke. No family history of pancreatic issues. No melena or hematochezia. No changes in bowel habits. No family history of colon cancer. Gallbladder remains in situ. She has clinically improved with resolution of symptoms. No concerning upper or lower GI symptoms.    Past Medical History  Diagnosis Date  . Diabetes mellitus without complication ([place])   . Pancreatitis, acute 09/29/2012, Nov 2016    Past Surgical History  Procedure Laterality Date  . None      Current Outpatient Prescriptions  Medication Sig Dispense Refill  . acetaminophen (TYLENOL) 500 MG tablet Take 500 mg by mouth every 6 (six) hours as needed for mild pain or moderate pain.    . fexofenadine (ALLEGRA) 180 MG tablet Take 180 mg by mouth daily.    . Insulin Glargine (LANTUS SOLOSTAR) 100 UNIT/ML Solostar Pen Inject 35 Units into the skin at bedtime.     . metFORMIN (GLUCOPHAGE-XR) 500 MG 24 hr tablet Take 1,000 mg by mouth 2 (two) times daily.     . Multiple Vitamin (MULTIVITAMIN) tablet Take 1 tablet by mouth daily. Reported on  07/19/2015     No current facility-administered medications for this visit.    Allergies as of 07/19/2015 - Review Complete 07/19/2015  Allergen Reaction Noted  . Aspirin Anaphylaxis and Swelling 09/28/2012  . Ibuprofen Anaphylaxis and Swelling 09/28/2012  . Latex Swelling 09/29/2012  . Other Swelling 09/29/2012    Family History  Problem Relation Age of Onset  . Diabetes Father   . Polymyalgia rheumatica Mother   . Sarcoidosis Mother   . Colon cancer Neg Hx     Social History   Social History  . Marital Status: Single    Spouse Name: N/A  . Number of Children: N/A  . Years of Education: N/A   Occupational History  . assistant principal     [employer]   Social History Main Topics  . Smoking status: Never Smoker   . Smokeless tobacco: Not on file  . Alcohol Use: No  . Drug Use: No  . Sexual Activity: No   Other Topics Concern  . Not on file   Social History Narrative    Review of Systems: Gen: Denies any fever, chills, fatigue, weight loss, lack of appetite.  CV: Denies chest pain, heart palpitations, peripheral edema, syncope.  Resp: Denies shortness of breath at rest or with exertion. Denies wheezing or cough.  GI: see HPI  GU : Denies urinary burning, urinary frequency, urinary hesitancy MS: Denies joint pain, muscle weakness, cramps, or limitation of movement.  Derm: Denies  rash, itching, dry skin Psych: Denies depression, anxiety, memory loss, and confusion Heme: Denies bruising, bleeding, and enlarged lymph nodes.  Physical Exam: BP 121/76 mmHg  Pulse 91  Temp(Src) 97.6 F (36.4 C) (Oral)  Ht 5\' 11"  (1.803 m)  Wt 226 lb 12.8 oz (102.876 kg)  BMI 31.65 kg/m2 General:   Alert and oriented. Pleasant and cooperative. Well-nourished and well-developed.  Head:  Normocephalic and atraumatic. Eyes:  Without icterus, sclera clear and conjunctiva pink.  Ears:  Normal auditory acuity. Nose:  No deformity, discharge,  or lesions. Mouth:   No deformity or lesions, oral mucosa pink.  Lungs:  Clear to auscultation bilaterally. No wheezes, rales, or rhonchi. No distress.  Heart:  S1, S2 present without murmurs appreciated.  Abdomen:  +BS, soft, non-tender and non-distended. No HSM noted. No guarding or rebound. No masses appreciated.  Rectal:  Deferred  Msk:  Symmetrical without gross deformities. Normal posture. Extremities:  Without edema. Neurologic:  Alert and  oriented x4;  grossly normal neurologically. Psych:  Alert and cooperative. Normal mood and affect.   Lab Results  Component Value Date   LIPASE 36 06/04/2015   Lab Results  Component Value Date   ALT 15 06/04/2015   AST 15 06/04/2015   ALKPHOS 66 06/04/2015   BILITOT 0.3 06/04/2015   Lab Results  Component Value Date   CREATININE 0.44 06/04/2015   BUN 6 06/04/2015   NA 135 06/04/2015   K 3.7 06/04/2015   CL 104 06/04/2015   CO2 26 06/04/2015   Lab Results  Component Value Date   WBC 8.6 06/04/2015   HGB 10.1* 06/04/2015   HCT 30.3* 06/04/2015   MCV 80.4 06/04/2015   PLT 240 06/04/2015   Lab Results  Component Value Date   IRON 10* 06/02/2015   TIBC 245* 06/02/2015   FERRITIN 66 06/02/2015

## 2015-07-19 NOTE — Assessment & Plan Note (Signed)
Hgb in 10-11 range with low iron but normal ferritin. Likely multifactorial and concern for iron deficiency evolving. No prior colonoscopy or upper endoscopy. As she is African-American and 23, she is due for initial screening colonoscopy at this time regardless. She has no concerning lower or upper GI symptoms, and no FH of colon cancer. Due to anemia, will proceed with colonoscopy +/- EGD now with Dr. Gala Romney. Start ferrous sulfate 325 mg po daily.   Proceed with TCS +/- EGD with Dr. Gala Romney in near future: the risks, benefits, and alternatives have been discussed with the patient in detail. The patient states understanding and desires to proceed.

## 2015-07-19 NOTE — Patient Instructions (Signed)
We have scheduled you for a colonoscopy and possible upper endoscopy with Dr. Gala Romney due to anemia.   We will arrange for an endoscopic ultrasound of your pancreas with Dr. Ardis Hughs in a few months when it is convenient for you.   Start taking iron (ferrous sulfate) 325 mg orally daily. You will need to hold this for 7 days before the colonoscopy.  Happy New Year!

## 2015-07-19 NOTE — Progress Notes (Signed)
cc'ed to pcp °

## 2015-07-19 NOTE — Assessment & Plan Note (Signed)
44 year old female with now second episode of pancreatitis, etiology unknown. LFTs have remained normal both in 2014 and most currently. Denies ETOH use, and lipid panel normal. Doubt biliary component. As this was her second occurrence, would favor proceeding with EUS with Dr. Ardis Hughs in the near future. She is unable to complete this until March. Discussed signs/symptoms that would necessitate sooner evaluation. Stated understanding, and we will plan on EUS around March with Dr. Ardis Hughs.

## 2015-07-21 ENCOUNTER — Other Ambulatory Visit: Payer: Self-pay

## 2015-07-21 ENCOUNTER — Telehealth: Payer: Self-pay

## 2015-07-21 DIAGNOSIS — K859 Acute pancreatitis without necrosis or infection, unspecified: Secondary | ICD-10-CM

## 2015-07-21 NOTE — Telephone Encounter (Signed)
OK, she needs upper EUS radial +/- linear for recurrent pancreatitis at her soonest convenience. +[facility], [facility]         Thanks            ----- Message -----     From: Barron Alvine, CMA     Sent: 07/21/2015  8:47 AM      To: Milus Banister, MD                ----- Message -----     From: Kathline Magic     Sent: 07/21/2015  8:41 AM      To: Barron Alvine, [place],    This patient is being referred for an EUS.

## 2015-07-21 NOTE — Telephone Encounter (Signed)
Pt hs been scheduled for EUS on 07/27/15.  Instructions have been mailed to the home. Left message on machine to call back

## 2015-07-25 NOTE — Telephone Encounter (Signed)
Left message on machine to call back  

## 2015-07-25 NOTE — Telephone Encounter (Signed)
Patient called in stating that she did not know about procedure at the hospital for 01/05. She states that she needs to reschedule this for sometime in March. She is hoping to reschedule for 03/21 or 03/22. Best # [phone]

## 2015-07-26 ENCOUNTER — Telehealth: Payer: Self-pay

## 2015-07-26 NOTE — Telephone Encounter (Signed)
Pt did not want to have EUS done tomorrow, states she wanted it to be scheduled in March. Procedure cancelled and informed pt that Patty will call her to schedule the EUS at a time that works better for her.

## 2015-07-27 ENCOUNTER — Ambulatory Visit (HOSPITAL_COMMUNITY)
Admission: RE | Admit: 2015-07-27 | Payer: PRIVATE HEALTH INSURANCE | Source: Ambulatory Visit | Admitting: Gastroenterology

## 2015-07-27 ENCOUNTER — Other Ambulatory Visit: Payer: Self-pay

## 2015-07-27 ENCOUNTER — Encounter (HOSPITAL_COMMUNITY): Admission: RE | Payer: Self-pay | Source: Ambulatory Visit

## 2015-07-27 DIAGNOSIS — K859 Acute pancreatitis without necrosis or infection, unspecified: Secondary | ICD-10-CM

## 2015-07-27 SURGERY — UPPER ENDOSCOPIC ULTRASOUND (EUS) LINEAR
Anesthesia: Monitor Anesthesia Care

## 2015-07-27 NOTE — Telephone Encounter (Signed)
I spoke with the pt and the appt has been rescheduled and new instructions have been mailed to the pt.

## 2015-08-14 ENCOUNTER — Encounter (HOSPITAL_COMMUNITY): Payer: Self-pay | Admitting: *Deleted

## 2015-08-14 ENCOUNTER — Ambulatory Visit (HOSPITAL_COMMUNITY)
Admission: RE | Admit: 2015-08-14 | Discharge: 2015-08-14 | Disposition: A | Discharge status: Home Health | Payer: PRIVATE HEALTH INSURANCE | Source: Ambulatory Visit | Attending: Internal Medicine | Admitting: Internal Medicine

## 2015-08-14 ENCOUNTER — Encounter (HOSPITAL_COMMUNITY)
Admission: RE | Disposition: A | Discharge status: Home Health | Payer: Self-pay | Source: Ambulatory Visit | Attending: Internal Medicine

## 2015-08-14 DIAGNOSIS — D123 Benign neoplasm of transverse colon: Secondary | ICD-10-CM

## 2015-08-14 DIAGNOSIS — Z794 Long term (current) use of insulin: Secondary | ICD-10-CM | POA: Insufficient documentation

## 2015-08-14 DIAGNOSIS — K859 Acute pancreatitis without necrosis or infection, unspecified: Secondary | ICD-10-CM | POA: Diagnosis not present

## 2015-08-14 DIAGNOSIS — D509 Iron deficiency anemia, unspecified: Secondary | ICD-10-CM

## 2015-08-14 DIAGNOSIS — E119 Type 2 diabetes mellitus without complications: Secondary | ICD-10-CM | POA: Diagnosis not present

## 2015-08-14 DIAGNOSIS — Q438 Other specified congenital malformations of intestine: Secondary | ICD-10-CM | POA: Insufficient documentation

## 2015-08-14 HISTORY — PX: COLONOSCOPY: SHX5424

## 2015-08-14 HISTORY — PX: ESOPHAGOGASTRODUODENOSCOPY: SHX5428

## 2015-08-14 LAB — GLUCOSE, CAPILLARY: GLUCOSE-CAPILLARY: 136 mg/dL — AB (ref 65–99)

## 2015-08-14 SURGERY — COLONOSCOPY
Anesthesia: Moderate Sedation

## 2015-08-14 MED ORDER — ONDANSETRON HCL 4 MG/2ML IJ SOLN
INTRAMUSCULAR | Status: DC | PRN
  Administered 2015-08-14: 4 mg via INTRAVENOUS

## 2015-08-14 MED ORDER — MIDAZOLAM HCL 5 MG/5ML IJ SOLN
INTRAMUSCULAR | Status: DC | PRN
  Administered 2015-08-14 (×3): 2 mg via INTRAVENOUS
  Administered 2015-08-14 (×2): 1 mg via INTRAVENOUS

## 2015-08-14 MED ORDER — MIDAZOLAM HCL 5 MG/5ML IJ SOLN
INTRAMUSCULAR | Status: AC
  Filled 2015-08-14: qty 10

## 2015-08-14 MED ORDER — MEPERIDINE HCL 100 MG/ML IJ SOLN
INTRAMUSCULAR | Status: DC | PRN
  Administered 2015-08-14: 50 mg via INTRAVENOUS
  Administered 2015-08-14 (×3): 25 mg via INTRAVENOUS

## 2015-08-14 MED ORDER — LIDOCAINE VISCOUS 2 % MT SOLN
OROMUCOSAL | Status: DC | PRN
  Administered 2015-08-14: 1 via OROMUCOSAL

## 2015-08-14 MED ORDER — SODIUM CHLORIDE 0.9 % IV SOLN
INTRAVENOUS | Status: DC
  Administered 2015-08-14: 12:00:00 via INTRAVENOUS

## 2015-08-14 MED ORDER — MEPERIDINE HCL 100 MG/ML IJ SOLN
INTRAMUSCULAR | Status: AC
  Filled 2015-08-14: qty 2

## 2015-08-14 MED ORDER — LIDOCAINE VISCOUS 2 % MT SOLN
OROMUCOSAL | Status: AC
  Filled 2015-08-14: qty 15

## 2015-08-14 MED ORDER — STERILE WATER FOR IRRIGATION IR SOLN
Status: DC | PRN
  Administered 2015-08-14: 14:00:00

## 2015-08-14 MED ORDER — ONDANSETRON HCL 4 MG/2ML IJ SOLN
INTRAMUSCULAR | Status: AC
  Filled 2015-08-14: qty 2

## 2015-08-14 NOTE — Interval H&P Note (Signed)
History and Physical Interval Note:  [id number] PM  Tara Kane  has presented today for surgery, with the diagnosis of iron deficiency anemia  The various methods of treatment have been discussed with the patient and family. After consideration of risks, benefits and other options for treatment, the patient has consented to  Procedure(s) with comments: COLONOSCOPY (N/A) - 1315 ESOPHAGOGASTRODUODENOSCOPY (EGD) (N/A) as a surgical intervention .  The patient's history has been reviewed, patient examined, no change in status, stable for surgery.  I have reviewed the patient's chart and labs.  Questions were answered to the patient's satisfaction.     [name]  No change. Colonoscopy followed by EGD as appropriate.  The risks, benefits, limitations, imponderables and alternatives regarding both EGD and colonoscopy have been reviewed with the patient. Questions have been answered. All parties agreeable.

## 2015-08-14 NOTE — Op Note (Signed)
[facility] [address] [place], [address]   ENDOSCOPY PROCEDURE REPORT  PATIENT: Tara Kane, Tara Kane  MR#: [id number] BIRTHDATE: 23-Nov-1970 , 30  yrs. old GENDER: female ENDOSCOPIST: R.  Garfield Cornea, MD FACP FACG REFERRED BY:  Iona Beard, M.D. PROCEDURE DATE:  08-26-2015 PROCEDURE:  EGD, diagnostic INDICATIONS:  Iron deficiency anemia; negative colonoscopy. MEDICATIONS: Versed 8 mg IV and Demerol 125 mg IV in divided doses. Zofran 4 mg IV. ASA CLASS:      Class II  CONSENT: The risks, benefits, limitations, alternatives and imponderables have been discussed.  The potential for biopsy, esophogeal dilation, etc. have also been reviewed.  Questions have been answered.  All parties agreeable.  Please see the history and physical in the medical record for more information.  DESCRIPTION OF PROCEDURE: After the risks benefits and alternatives of the procedure were thoroughly explained, informed consent was obtained.  The [id number] endoscope was introduced through the mouth and advanced to the second portion of the duodenum , limited by Without limitations. The instrument was slowly withdrawn as the mucosa was fully examined. Estimated blood loss is zero unless otherwise noted in this procedure report.    Normal-appearing esophageal mucosa.  Stomach empty. Normal-appearing gastric mucosa.  Patent pylorus.  Normal-appearing first and second portion of the duodenum.  Because of technical problems, photographs could not be taken. Retroflexed views revealed no abnormalities.     The scope was then withdrawn from the patient and the procedure completed.  COMPLICATIONS: There were no immediate complications.  ENDOSCOPIC IMPRESSION: Normal EGD  RECOMMENDATIONS: See colonoscopy report. Follow up on pathology  REPEAT EXAM:  eSigned:  R. Garfield Cornea, MD Rosalita Chessman [facility] Aug 26, 2015 2:19 PM    CC:  CPT CODES: ICD CODES:  The ICD and CPT codes  recommended by this software are interpretations from the data that the clinical staff has captured with the software.  The verification of the translation of this report to the ICD and CPT codes and modifiers is the sole responsibility of the health care institution and practicing physician where this report was generated.  [employer] [place]. will not be held responsible for the validity of the ICD and CPT codes included on this report.  AMA assumes no liability for data contained or not contained herein. CPT is a Designer, television/film set of the [employer].

## 2015-08-14 NOTE — Op Note (Signed)
[facility] [address] [place], [address]   COLONOSCOPY PROCEDURE REPORT  PATIENT: [name], [name]  MR#: [id number] BIRTHDATE: 1971/01/26 , 72  yrs. old GENDER: female ENDOSCOPIST: R.  Garfield Cornea, MD FACP [facility] REFERRED [id number] Hill, M.D. PROCEDURE DATE:  09/08/15 PROCEDURE:   Ileo-colonoscopy with snare polypectomy INDICATIONS:iron deficiency anemia; first ever colonoscopy. MEDICATIONS: Versed 6 mg IV and Demerol 100 mg IV in divided doses. Zofran 4 mg IV. ASA CLASS:       Class II  CONSENT: The risks, benefits, alternatives and imponderables including but not limited to bleeding, perforation as well as the possibility of a missed lesion have been reviewed.  The potential for biopsy, lesion removal, etc. have also been discussed. Questions have been answered.  All parties agreeable.  Please see the history and physical in the medical record for more information.  DESCRIPTION OF PROCEDURE:   After the risks benefits and alternatives of the procedure were thoroughly explained, informed consent was obtained.  The digital rectal exam revealed no abnormalities of the rectum.   The [id number])  endoscope was introduced through the anus and advanced to the terminal ileum which was intubated for a short distance. No adverse events experienced.   Patient's colon was redundant. Some external abdominal pressure was required to reach the cecum.The quality of the prep was adequate  The instrument was then slowly withdrawn as the colon was fully examined. Estimated blood loss is zero unless otherwise noted in this procedure report.      COLON FINDINGS: Normal-appearing rectal mucosa.  (1) 5 mm polyp in the mid transverse colon; otherwise, the remainder of the colonic mucosa appeared normal.  The distal 5 cm of terminal ileal mucosa also appeared normal.  The above-mentioned polyp was cold snare removed.  Retroflexion was performed.  .  Withdrawal time=9 minutes 0 seconds.  The scope was withdrawn and the procedure completed. COMPLICATIONS: There were no immediate complications. EBL 3 mL ENDOSCOPIC IMPRESSION: Redundant colon. Single colonic polyp?"removed as described above  RECOMMENDATIONS: Follow-up on pathology. See EGD report.  eSigned:  R. Garfield Cornea, MD Rosalita Chessman [facility] September 08, 2015 2:03 PM   cc:  CPT CODES: ICD CODES:  The ICD and CPT codes recommended by this software are interpretations from the data that the clinical staff has captured with the software.  The verification of the translation of this report to the ICD and CPT codes and modifiers is the sole responsibility of the health care institution and practicing physician where this report was generated.  [employer]. will not be held responsible for the validity of the ICD and CPT codes included on this report.  AMA assumes no liability for data contained or not contained herein. CPT is a Designer, television/film set of the [employer].  PATIENT NAME:  Tara Kane, Tara Kane MR#: [id number]

## 2015-08-14 NOTE — Discharge Instructions (Signed)
Colonoscopy Discharge Instructions  Read the instructions outlined below and refer to this sheet in the next few weeks. These discharge instructions provide you with general information on caring for yourself after you leave the hospital. Your doctor may also give you specific instructions. While your treatment has been planned according to the most current medical practices available, unavoidable complications occasionally occur. If you have any problems or questions after discharge, call Dr. Gala Romney at [phone]. ACTIVITY  You may resume your regular activity, but move at a slower pace for the next 24 hours.   Take frequent rest periods for the next 24 hours.   Walking will help get rid of the air and reduce the bloated feeling in your belly (abdomen).   No driving for 24 hours (because of the medicine (anesthesia) used during the test).    Do not sign any important legal documents or operate any machinery for 24 hours (because of the anesthesia used during the test).  NUTRITION  Drink plenty of fluids.   You may resume your normal diet as instructed by your doctor.   Begin with a light meal and progress to your normal diet. Heavy or fried foods are harder to digest and may make you feel sick to your stomach (nauseated).   Avoid alcoholic beverages for 24 hours or as instructed.  MEDICATIONS  You may resume your normal medications unless your doctor tells you otherwise.  WHAT YOU CAN EXPECT TODAY  Some feelings of bloating in the abdomen.   Passage of more gas than usual.   Spotting of blood in your stool or on the toilet paper.  IF YOU HAD POLYPS REMOVED DURING THE COLONOSCOPY:  No aspirin products for 7 days or as instructed.   No alcohol for 7 days or as instructed.   Eat a soft diet for the next 24 hours.  FINDING OUT THE RESULTS OF YOUR TEST Not all test results are available during your visit. If your test results are not back during the visit, make an appointment  with your caregiver to find out the results. Do not assume everything is normal if you have not heard from your caregiver or the medical facility. It is important for you to follow up on all of your test results.  SEEK IMMEDIATE MEDICAL ATTENTION IF:  You have more than a spotting of blood in your stool.   Your belly is swollen (abdominal distention).   You are nauseated or vomiting.   You have a temperature over 101.  You have abdominal pain or discomfort that is severe or gets worse throughout the day. EGD Discharge instructions Please read the instructions outlined below and refer to this sheet in the next few weeks. These discharge instructions provide you with general information on caring for yourself after you leave the hospital. Your doctor may also give you specific instructions. While your treatment has been planned according to the most current medical practices available, unavoidable complications occasionally occur. If you have any problems or questions after discharge, please call your doctor. ACTIVITY You may resume your regular activity but move at a slower pace for the next 24 hours.  Take frequent rest periods for the next 24 hours.  Walking will help expel (get rid of) the air and reduce the bloated feeling in your abdomen.  No driving for 24 hours (because of the anesthesia (medicine) used during the test).  You may shower.  Do not sign any important legal documents or operate any machinery for 24  hours (because of the anesthesia used during the test).  NUTRITION Drink plenty of fluids.  You may resume your normal diet.  Begin with a light meal and progress to your normal diet.  Avoid alcoholic beverages for 24 hours or as instructed by your caregiver.  MEDICATIONS You may resume your normal medications unless your caregiver tells you otherwise.  WHAT YOU CAN EXPECT TODAY You may experience abdominal discomfort such as a feeling of fullness or gas pains.   FOLLOW-UP Your doctor will discuss the results of your test with you.  SEEK IMMEDIATE MEDICAL ATTENTION IF ANY OF THE FOLLOWING OCCUR: Excessive nausea (feeling sick to your stomach) and/or vomiting.  Severe abdominal pain and distention (swelling).  Trouble swallowing.  Temperature over 101 F (37.8 C).  Rectal bleeding or vomiting of blood.     Colon polyp information provided  Further recommendations to follow pending review of pathology report  Colon Polyps Polyps are lumps of extra tissue growing inside the body. Polyps can grow in the large intestine (colon). Most colon polyps are noncancerous (benign). However, some colon polyps can become cancerous over time. Polyps that are larger than a pea may be harmful. To be safe, caregivers remove and test all polyps. CAUSES  Polyps form when mutations in the genes cause your cells to grow and divide even though no more tissue is needed. RISK FACTORS There are a number of risk factors that can increase your chances of getting colon polyps. They include:  Being older than 50 years.  Family history of colon polyps or colon cancer.  Long-term colon diseases, such as colitis or Crohn disease.  Being overweight.  Smoking.  Being inactive.  Drinking too much alcohol. SYMPTOMS  Most small polyps do not cause symptoms. If symptoms are present, they may include:  Blood in the stool. The stool may look dark red or black.  Constipation or diarrhea that lasts longer than 1 week. DIAGNOSIS People often do not know they have polyps until their caregiver finds them during a regular checkup. Your caregiver can use 4 tests to check for polyps:  Digital rectal exam. The caregiver wears gloves and feels inside the rectum. This test would find polyps only in the rectum.  Barium enema. The caregiver puts a liquid called barium into your rectum before taking X-rays of your colon. Barium makes your colon look white. Polyps are dark, so they  are easy to see in the X-ray pictures.  Sigmoidoscopy. A thin, flexible tube (sigmoidoscope) is placed into your rectum. The sigmoidoscope has a light and tiny camera in it. The caregiver uses the sigmoidoscope to look at the last third of your colon.  Colonoscopy. This test is like sigmoidoscopy, but the caregiver looks at the entire colon. This is the most common method for finding and removing polyps. TREATMENT  Any polyps will be removed during a sigmoidoscopy or colonoscopy. The polyps are then tested for cancer. PREVENTION  To help lower your risk of getting more colon polyps:  Eat plenty of fruits and vegetables. Avoid eating fatty foods.  Do not smoke.  Avoid drinking alcohol.  Exercise every day.  Lose weight if recommended by your caregiver.  Eat plenty of calcium and folate. Foods that are rich in calcium include milk, cheese, and broccoli. Foods that are rich in folate include chickpeas, kidney beans, and spinach. HOME CARE INSTRUCTIONS Keep all follow-up appointments as directed by your caregiver. You may need periodic exams to check for polyps. SEEK MEDICAL CARE IF:  You notice bleeding during a bowel movement.   This information is not intended to replace advice given to you by your health care provider. Make sure you discuss any questions you have with your health care provider.

## 2015-08-14 NOTE — H&P (View-Only) (Signed)
Primary Care Physician:  Maggie Font, MD Primary Gastroenterologist:  Dr. Gala Romney   Chief Complaint  Patient presents with  . Follow-up    HPI:   Tara Kane is a 45 y.o. female presenting today at the request of [facility] hospitalist service due to recent admission for recurrent pancreatitis. She had a bump in her lipase in 2014 and was inpatient. At that time, LFTs were normal and lipid panel unimpressive. [place] of abdomen without gallstones at that time. It was felt that ramipril may have been the culprit. She presented again to the ED in Nov 2016 with mild, uncomplicated pancreatitis and CT with mild acute pancreatitis. Again, her LFTs were normal, lipid panel normal. Her labs showed anemia, with low iron but normal ferritin.    Presents today without any further abdominal pain. States she has a cousin who suffers from pancreatitis. Denies any ETOH use. Only drinks diet coke. No family history of pancreatic issues. No melena or hematochezia. No changes in bowel habits. No family history of colon cancer. Gallbladder remains in situ. She has clinically improved with resolution of symptoms. No concerning upper or lower GI symptoms.    Past Medical History  Diagnosis Date  . Diabetes mellitus without complication ([place])   . Pancreatitis, acute 09/29/2012, Nov 2016    Past Surgical History  Procedure Laterality Date  . None      Current Outpatient Prescriptions  Medication Sig Dispense Refill  . acetaminophen (TYLENOL) 500 MG tablet Take 500 mg by mouth every 6 (six) hours as needed for mild pain or moderate pain.    . fexofenadine (ALLEGRA) 180 MG tablet Take 180 mg by mouth daily.    . Insulin Glargine (LANTUS SOLOSTAR) 100 UNIT/ML Solostar Pen Inject 35 Units into the skin at bedtime.     . metFORMIN (GLUCOPHAGE-XR) 500 MG 24 hr tablet Take 1,000 mg by mouth 2 (two) times daily.     . Multiple Vitamin (MULTIVITAMIN) tablet Take 1 tablet by mouth daily. Reported on  07/19/2015     No current facility-administered medications for this visit.    Allergies as of 07/19/2015 - Review Complete 07/19/2015  Allergen Reaction Noted  . Aspirin Anaphylaxis and Swelling 09/28/2012  . Ibuprofen Anaphylaxis and Swelling 09/28/2012  . Latex Swelling 09/29/2012  . Other Swelling 09/29/2012    Family History  Problem Relation Age of Onset  . Diabetes Father   . Polymyalgia rheumatica Mother   . Sarcoidosis Mother   . Colon cancer Neg Hx     Social History   Social History  . Marital Status: Single    Spouse Name: N/A  . Number of Children: N/A  . Years of Education: N/A   Occupational History  . assistant principal     [employer]   Social History Main Topics  . Smoking status: Never Smoker   . Smokeless tobacco: Not on file  . Alcohol Use: No  . Drug Use: No  . Sexual Activity: No   Other Topics Concern  . Not on file   Social History Narrative    Review of Systems: Gen: Denies any fever, chills, fatigue, weight loss, lack of appetite.  CV: Denies chest pain, heart palpitations, peripheral edema, syncope.  Resp: Denies shortness of breath at rest or with exertion. Denies wheezing or cough.  GI: see HPI  GU : Denies urinary burning, urinary frequency, urinary hesitancy MS: Denies joint pain, muscle weakness, cramps, or limitation of movement.  Derm: Denies  rash, itching, dry skin Psych: Denies depression, anxiety, memory loss, and confusion Heme: Denies bruising, bleeding, and enlarged lymph nodes.  Physical Exam: BP 121/76 mmHg  Pulse 91  Temp(Src) 97.6 F (36.4 C) (Oral)  Ht 5\' 11"  (1.803 m)  Wt 226 lb 12.8 oz (102.876 kg)  BMI 31.65 kg/m2 General:   Alert and oriented. Pleasant and cooperative. Well-nourished and well-developed.  Head:  Normocephalic and atraumatic. Eyes:  Without icterus, sclera clear and conjunctiva pink.  Ears:  Normal auditory acuity. Nose:  No deformity, discharge,  or lesions. Mouth:   No deformity or lesions, oral mucosa pink.  Lungs:  Clear to auscultation bilaterally. No wheezes, rales, or rhonchi. No distress.  Heart:  S1, S2 present without murmurs appreciated.  Abdomen:  +BS, soft, non-tender and non-distended. No HSM noted. No guarding or rebound. No masses appreciated.  Rectal:  Deferred  Msk:  Symmetrical without gross deformities. Normal posture. Extremities:  Without edema. Neurologic:  Alert and  oriented x4;  grossly normal neurologically. Psych:  Alert and cooperative. Normal mood and affect.   Lab Results  Component Value Date   LIPASE 36 06/04/2015   Lab Results  Component Value Date   ALT 15 06/04/2015   AST 15 06/04/2015   ALKPHOS 66 06/04/2015   BILITOT 0.3 06/04/2015   Lab Results  Component Value Date   CREATININE 0.44 06/04/2015   BUN 6 06/04/2015   NA 135 06/04/2015   K 3.7 06/04/2015   CL 104 06/04/2015   CO2 26 06/04/2015   Lab Results  Component Value Date   WBC 8.6 06/04/2015   HGB 10.1* 06/04/2015   HCT 30.3* 06/04/2015   MCV 80.4 06/04/2015   PLT 240 06/04/2015   Lab Results  Component Value Date   IRON 10* 06/02/2015   TIBC 245* 06/02/2015   FERRITIN 66 06/02/2015

## 2015-08-16 ENCOUNTER — Encounter: Payer: Self-pay | Admitting: Internal Medicine

## 2015-08-17 ENCOUNTER — Telehealth: Payer: Self-pay

## 2015-08-17 ENCOUNTER — Other Ambulatory Visit: Payer: Self-pay | Admitting: Internal Medicine

## 2015-08-17 ENCOUNTER — Encounter: Payer: Self-pay | Admitting: Internal Medicine

## 2015-08-17 ENCOUNTER — Other Ambulatory Visit: Payer: Self-pay

## 2015-08-17 DIAGNOSIS — D509 Iron deficiency anemia, unspecified: Secondary | ICD-10-CM

## 2015-08-17 NOTE — Telephone Encounter (Signed)
Letter and lab order mailed to the pt.  Please schedule ov. 

## 2015-08-17 NOTE — Telephone Encounter (Signed)
APPT MADE AND LETTER SENT  °

## 2015-08-17 NOTE — Telephone Encounter (Signed)
Per RMR- Send letter to patient.  Send copy of letter with path to referring provider and PCP.    Need a CBC along with an office visit with extender about 6 weeks reference IDA

## 2015-08-22 ENCOUNTER — Encounter (HOSPITAL_COMMUNITY): Payer: Self-pay | Admitting: Internal Medicine

## 2015-09-07 LAB — CBC WITH DIFFERENTIAL/PLATELET
Basophils Absolute: 0 10*3/uL (ref 0.0–0.1)
Basophils Relative: 0 % (ref 0–1)
EOS ABS: 0 10*3/uL (ref 0.0–0.7)
Eosinophils Relative: 0 % (ref 0–5)
HEMATOCRIT: 36.3 % (ref 36.0–46.0)
Hemoglobin: 11.7 g/dL — ABNORMAL LOW (ref 12.0–15.0)
LYMPHS ABS: 1.9 10*3/uL (ref 0.7–4.0)
Lymphocytes Relative: 19 % (ref 12–46)
MCH: 26.1 pg (ref 26.0–34.0)
MCHC: 32.2 g/dL (ref 30.0–36.0)
MCV: 81 fL (ref 78.0–100.0)
MONOS PCT: 6 % (ref 3–12)
MPV: 11.5 fL (ref 8.6–12.4)
Monocytes Absolute: 0.6 10*3/uL (ref 0.1–1.0)
Neutro Abs: 7.5 10*3/uL (ref 1.7–7.7)
Neutrophils Relative %: 75 % (ref 43–77)
PLATELETS: 237 10*3/uL (ref 150–400)
RBC: 4.48 MIL/uL (ref 3.87–5.11)
RDW: 16.2 % — ABNORMAL HIGH (ref 11.5–15.5)
WBC: 10 10*3/uL (ref 4.0–10.5)

## 2015-09-20 ENCOUNTER — Encounter (HOSPITAL_COMMUNITY): Payer: Self-pay | Admitting: *Deleted

## 2015-09-20 HISTORY — PX: EUS: SHX5427

## 2015-09-28 ENCOUNTER — Ambulatory Visit (HOSPITAL_COMMUNITY)
Admission: RE | Admit: 2015-09-28 | Discharge: 2015-09-28 | Disposition: A | Discharge status: Home / Self Care | Payer: PRIVATE HEALTH INSURANCE | Source: Ambulatory Visit | Attending: Gastroenterology | Admitting: Gastroenterology

## 2015-09-28 ENCOUNTER — Encounter (HOSPITAL_COMMUNITY): Payer: Self-pay

## 2015-09-28 ENCOUNTER — Ambulatory Visit (HOSPITAL_COMMUNITY): Payer: PRIVATE HEALTH INSURANCE | Admitting: Anesthesiology

## 2015-09-28 ENCOUNTER — Encounter (HOSPITAL_COMMUNITY)
Admission: RE | Disposition: A | Discharge status: Home / Self Care | Payer: Self-pay | Source: Ambulatory Visit | Attending: Gastroenterology

## 2015-09-28 DIAGNOSIS — K869 Disease of pancreas, unspecified: Secondary | ICD-10-CM | POA: Diagnosis not present

## 2015-09-28 DIAGNOSIS — K859 Acute pancreatitis without necrosis or infection, unspecified: Secondary | ICD-10-CM | POA: Diagnosis not present

## 2015-09-28 DIAGNOSIS — E119 Type 2 diabetes mellitus without complications: Secondary | ICD-10-CM | POA: Insufficient documentation

## 2015-09-28 DIAGNOSIS — Z794 Long term (current) use of insulin: Secondary | ICD-10-CM | POA: Diagnosis not present

## 2015-09-28 DIAGNOSIS — K8689 Other specified diseases of pancreas: Secondary | ICD-10-CM | POA: Insufficient documentation

## 2015-09-28 HISTORY — DX: Anemia, unspecified: D64.9

## 2015-09-28 HISTORY — PX: EUS: SHX5427

## 2015-09-28 HISTORY — DX: Allergy status to unspecified drugs, medicaments and biological substances: Z88.9

## 2015-09-28 LAB — GLUCOSE, CAPILLARY: GLUCOSE-CAPILLARY: 139 mg/dL — AB (ref 65–99)

## 2015-09-28 SURGERY — UPPER ENDOSCOPIC ULTRASOUND (EUS) LINEAR
Anesthesia: Monitor Anesthesia Care

## 2015-09-28 MED ORDER — PROPOFOL 500 MG/50ML IV EMUL
INTRAVENOUS | Status: DC | PRN
  Administered 2015-09-28: 125 ug/kg/min via INTRAVENOUS

## 2015-09-28 MED ORDER — LIDOCAINE HCL (CARDIAC) 20 MG/ML IV SOLN
INTRAVENOUS | Status: DC | PRN
  Administered 2015-09-28: 20 mg via INTRATRACHEAL

## 2015-09-28 MED ORDER — SODIUM CHLORIDE 0.9 % IV SOLN: INTRAVENOUS | Status: DC

## 2015-09-28 MED ORDER — LIDOCAINE HCL (CARDIAC) 20 MG/ML IV SOLN
INTRAVENOUS | Status: AC
  Filled 2015-09-28: qty 5

## 2015-09-28 MED ORDER — LACTATED RINGERS IV SOLN
INTRAVENOUS | Status: DC
  Administered 2015-09-28: 1000 mL via INTRAVENOUS

## 2015-09-28 MED ORDER — PROPOFOL 10 MG/ML IV BOLUS
INTRAVENOUS | Status: DC | PRN
  Administered 2015-09-28 (×4): 20 mg via INTRAVENOUS

## 2015-09-28 MED ORDER — PROPOFOL 10 MG/ML IV BOLUS
INTRAVENOUS | Status: AC
  Filled 2015-09-28: qty 60

## 2015-09-28 NOTE — Transfer of Care (Signed)
Immediate Anesthesia Transfer of Care Note  Patient: Tara Kane  Procedure(s) Performed: Procedure(s): UPPER ENDOSCOPIC ULTRASOUND (EUS) LINEAR (N/A)  Patient Location: PACU and Endoscopy Unit  Anesthesia Type:MAC  Level of Consciousness: awake, alert  and patient cooperative  Airway & Oxygen Therapy: Patient Spontanous Breathing and Patient connected to nasal cannula oxygen  Post-op Assessment: Report given to RN and Post -op Vital signs reviewed and stable  Post vital signs: Reviewed and stable  Last Vitals:  Filed Vitals:   09/28/15 0625  BP: 120/66  Pulse: 83  Temp: 36.7 C  Resp: 13    Complications: No apparent anesthesia complications

## 2015-09-28 NOTE — Anesthesia Preprocedure Evaluation (Signed)
Anesthesia Evaluation  Patient identified by MRN, date of birth, ID band Patient awake    Reviewed: Allergy & Precautions, NPO status , Patient's Chart, lab work & pertinent test results  History of Anesthesia Complications (+) PONV  Airway Mallampati: II  TM Distance: >3 FB Neck ROM: Full    Dental no notable dental hx.    Pulmonary neg pulmonary ROS,    Pulmonary exam normal breath sounds clear to auscultation       Cardiovascular negative cardio ROS Normal cardiovascular exam Rhythm:Regular Rate:Normal     Neuro/Psych negative neurological ROS  negative psych ROS   GI/Hepatic negative GI ROS, Neg liver ROS,   Endo/Other  diabetes, Insulin Dependent  Renal/GU negative Renal ROS  negative genitourinary   Musculoskeletal negative musculoskeletal ROS (+)   Abdominal   Peds negative pediatric ROS (+)  Hematology negative hematology ROS (+)   Anesthesia Other Findings   Reproductive/Obstetrics negative OB ROS                             Anesthesia Physical Anesthesia Plan  ASA: III  Anesthesia Plan: MAC   Post-op Pain Management:    Induction: Intravenous  Airway Management Planned: Nasal Cannula  Additional Equipment:   Intra-op Plan:   Post-operative Plan:   Informed Consent: I have reviewed the patients History and Physical, chart, labs and discussed the procedure including the risks, benefits and alternatives for the proposed anesthesia with the patient or authorized representative who has indicated his/her understanding and acceptance.   Dental advisory given  Plan Discussed with: CRNA and Surgeon  Anesthesia Plan Comments:         Anesthesia Quick Evaluation

## 2015-09-28 NOTE — Anesthesia Postprocedure Evaluation (Signed)
Anesthesia Post Note  Patient: Tara Kane  Procedure(s) Performed: Procedure(s) (LRB): UPPER ENDOSCOPIC ULTRASOUND (EUS) LINEAR (N/A)  Patient location during evaluation: PACU Anesthesia Type: MAC Level of consciousness: awake and alert Pain management: pain level controlled Vital Signs Assessment: post-procedure vital signs reviewed and stable Respiratory status: spontaneous breathing, nonlabored ventilation, respiratory function stable and patient connected to nasal cannula oxygen Cardiovascular status: blood pressure returned to baseline and stable Postop Assessment: no signs of nausea or vomiting Anesthetic complications: no    Last Vitals:  Filed Vitals:   09/28/15 0751 09/28/15 0800  BP: 112/58 125/64  Pulse: 91 84  Temp: 36.4 C   Resp: 14 18    Last Pain: There were no vitals filed for this visit.               Danee Soller S

## 2015-09-28 NOTE — Discharge Instructions (Signed)

## 2015-09-28 NOTE — Op Note (Signed)
[facility] Patient Name: Tara Kane Procedure Date: 09/28/2015 MRN: [id number] Attending MD: Milus Banister , MD Date of Birth: May 02, 1971 CSN:  Age: 45 Admit Type: Outpatient Account #: [id number] Procedure:                Upper EUS Indications:              Acute recurrent pancreatitis Providers:                Milus Banister, MD, Cleda Daub, RN, Alfonso Patten, Technician, Dione Booze, CRNA Referring MD:             Norvel Richards, MD Medicines:                Monitored Anesthesia Care Complications:            No immediate complications. Estimated Blood Loss:     Estimated blood loss: none. Procedure:                Pre-Anesthesia Assessment:                           - Prior to the procedure, a History and Physical                            was performed, and patient medications and                            allergies were reviewed. The patient's tolerance of                            previous anesthesia was also reviewed. The risks                            and benefits of the procedure and the sedation                            options and risks were discussed with the patient.                            All questions were answered, and informed consent                            was obtained. Prior Anticoagulants: The patient has                            taken no previous anticoagulant or antiplatelet                            agents. ASA Grade Assessment: II - A patient with                            mild systemic disease. After reviewing the risks  and benefits, the patient was deemed in                            satisfactory condition to undergo the procedure.                           After obtaining informed consent, the endoscope was                            passed under direct vision. Throughout the                            procedure, the patient's blood pressure,  pulse, and                            oxygen saturations were monitored continuously. The                            [id number]) scope was introduced through                            the mouth, and advanced to the second part of                            duodenum. The upper EUS was accomplished without                            difficulty. The patient tolerated the procedure                            well. Scope In: Scope Out: Findings:      Endoscopic Finding :      The examined esophagus was endoscopically normal.      The entire examined stomach was endoscopically normal.      The examined duodenum was endoscopically normal.      Endosonographic Finding :      Pancreatic parenchymal abnormalities were noted in the entire pancreas.       These consisted of diffuse echogenicity, hyperechoic strands and       lobularity. The main pancreatic duct was slightly ectatic throughout the       gland.      There was no sign of significant endosonographic abnormality in the       common bile duct and in the gallbladder.      There was no sign of significant endosonographic abnormality in the left       lobe of the liver. Impression:               - Normal esophagus.                           - Normal stomach.                           - Normal examined duodenum.                           -  Pancreatic parenchymal abnormalities consisting                            of diffuse echogenicity, hyperechoic strands and                            lobularity were noted in the entire pancreas. These                            findings are suggestive of chronic pancreatitis.                           - There was no sign of significant pathology in the                            common bile duct and in the gallbladder.                           - There was no evidence of significant pathology in                            the left lobe of the liver.                           - No specimens  collected. Moderate Sedation:      N/A- Per Anesthesia Care Recommendation:           - Return to referring physician. Procedure Code(s):        --- Professional ---                           [id number], Esophagogastroduodenoscopy, flexible,                            transoral; with endoscopic ultrasound examination                            limited to the esophagus, stomach or duodenum, and                            adjacent structures Diagnosis Code(s):        --- Professional ---                           K86.9, Disease of pancreas, unspecified                           K85.90, Acute pancreatitis without necrosis or                            infection, unspecified CPT copyright 2016 American Medical Association. All rights reserved. The codes documented in this report are preliminary and upon coder review may  be revised to meet current compliance requirements. Milus Banister, MD Milus Banister, MD 09/28/2015 7:56:11 AM Number of Addenda: 0

## 2015-09-28 NOTE — H&P (Signed)
  HPI: This is a woman referred by Dr. Gala Romney   Chief complaint is recurrent mild AP   Past Medical History  Diagnosis Date  . Pancreatitis, acute 09/29/2012, Nov 2016  . Hx of seasonal allergies   . Diabetes mellitus without complication ([place])     15 yrs Lantus use.  . Anemia     Past Surgical History  Procedure Laterality Date  . Eye surgery    . Colonoscopy N/A 08/14/2015    Procedure: COLONOSCOPY;  Surgeon: Daneil Dolin, MD;  Location: [facility];  Service: Endoscopy;  Laterality: N/A;  1315  . Esophagogastroduodenoscopy N/A 08/14/2015    Procedure: ESOPHAGOGASTRODUODENOSCOPY (EGD);  Surgeon: Daneil Dolin, MD;  Location: [facility] ENDO [facility];  Service: Endoscopy;  Laterality: N/A;    Current Facility-Administered Medications  Medication Dose Route Frequency Provider Last Rate Last Dose  . 0.9 %  sodium chloride infusion   Intravenous Continuous Milus Banister, MD      . lactated ringers infusion   Intravenous Continuous Milus Banister, MD        Allergies as of 07/27/2015 - Review Complete 07/19/2015  Allergen Reaction Noted  . Aspirin Anaphylaxis and Swelling 09/28/2012  . Ibuprofen Anaphylaxis and Swelling 09/28/2012  . Latex Swelling 09/29/2012  . Other Swelling 09/29/2012    Family History  Problem Relation Age of Onset  . Diabetes Father   . Polymyalgia rheumatica Mother   . Sarcoidosis Mother   . Colon cancer Neg Hx     Social History   Social History  . Marital Status: Single    Spouse Name: N/A  . Number of Children: N/A  . Years of Education: N/A   Occupational History  . assistant principal     [employer]   Social History Main Topics  . Smoking status: Never Smoker   . Smokeless tobacco: Not on file  . Alcohol Use: No  . Drug Use: No  . Sexual Activity: No   Other Topics Concern  . Not on file   Social History Narrative     Physical Exam: BP 120/66 mmHg  Pulse 83  Temp(Src) 98 F (36.7 C) (Oral)  Resp 13  Ht 5'  10" (1.778 m)  Wt 220 lb (99.791 kg)  BMI 31.57 kg/m2  SpO2 97%  LMP 09/18/2015 Constitutional: generally well-appearing Psychiatric: alert and oriented x3 Abdomen: soft, nontender, nondistended, no obvious ascites, no peritoneal signs, normal bowel sounds   Assessment and plan: 45 y.o. female with recurrent mild AP  Normal GB on [place] 2014.  Normal LFTs during her 2 events  For upper EUS now.   Owens Loffler, MD [facility] 09/28/2015, 7:04 AM

## 2015-10-04 ENCOUNTER — Ambulatory Visit (INDEPENDENT_AMBULATORY_CARE_PROVIDER_SITE_OTHER): Payer: PRIVATE HEALTH INSURANCE | Admitting: Gastroenterology

## 2015-10-04 ENCOUNTER — Encounter: Payer: Self-pay | Admitting: Gastroenterology

## 2015-10-04 VITALS — BP 131/80 | HR 96 | Temp 98.1°F | Ht 70.0 in | Wt 228.2 lb

## 2015-10-04 DIAGNOSIS — K861 Other chronic pancreatitis: Secondary | ICD-10-CM | POA: Insufficient documentation

## 2015-10-04 DIAGNOSIS — D649 Anemia, unspecified: Secondary | ICD-10-CM | POA: Diagnosis not present

## 2015-10-04 NOTE — Patient Instructions (Signed)
We will see you back as needed.  Your next colonoscopy is in 5 years.  Let us know if you would like to pursue further work-up!

## 2015-10-04 NOTE — Progress Notes (Signed)
cc'ed to pcp °

## 2015-10-04 NOTE — Progress Notes (Signed)
Referring Provider: Iona Beard, MD Primary Care Physician:  Maggie Font, MD  Primary GI: Dr. Gala Romney   Chief Complaint  Patient presents with  . Follow-up    HPI:   Tara Kane is a 45 y.o. female presenting today with a history of normocytic anemia with low iron and normal ferritin, Hgb improved since last visit. Colonoscopy/EGD on file overall unimpressive. Due for surveillance colonoscopy in 5 years. Episodes of pancreatitis 2014 and 2016, with lipid panel normal and denies ETOH use. For this reason, EUS performed noting chronic pancreatitis. Etiology uncertain.   Uses stevia. No further diet drinks. Felt like she needed to cut this out. Has no GI complaints today. Has read a lot about pancreatitis. Eating healthy. Would like to hold off on further evaluation for now as she just went through several procedures.   Past Medical History  Diagnosis Date  . Pancreatitis, acute 09/29/2012, Nov 2016  . Hx of seasonal allergies   . Diabetes mellitus without complication ([place])     15 yrs Lantus use.  . Anemia     Past Surgical History  Procedure Laterality Date  . Eye surgery    . Colonoscopy N/A 08/14/2015    [id number] colon/single colonic polyp (tubular adenoma). Surveillance in 5 years  . Esophagogastroduodenoscopy N/A 08/14/2015    [id number]  . Eus  09/2015    Dr. Ardis Hughs: chronic pancreatitis    Current Outpatient Prescriptions  Medication Sig Dispense Refill  . acetaminophen (TYLENOL) 500 MG tablet Take 500 mg by mouth every 6 (six) hours as needed for mild pain or moderate pain.    . fexofenadine (ALLEGRA) 180 MG tablet Take 180 mg by mouth daily.    . Insulin Glargine (LANTUS SOLOSTAR) 100 UNIT/ML Solostar Pen Inject 32 Units into the skin at bedtime.     . metFORMIN (GLUCOPHAGE-XR) 500 MG 24 hr tablet Take 1,000 mg by mouth 2 (two) times daily.     . Multiple Vitamin (MULTIVITAMIN) tablet Take 1 tablet by mouth daily. Reported on 07/19/2015     No  current facility-administered medications for this visit.    Allergies as of 10/04/2015 - Review Complete 10/04/2015  Allergen Reaction Noted  . Aspirin Anaphylaxis and Swelling 09/28/2012  . Ibuprofen Anaphylaxis and Swelling 09/28/2012  . Latex Swelling 09/29/2012  . Other Swelling 09/29/2012    Family History  Problem Relation Age of Onset  . Diabetes Father   . Polymyalgia rheumatica Mother   . Sarcoidosis Mother   . Colon cancer Neg Hx     Social History   Social History  . Marital Status: Single    Spouse Name: N/A  . Number of Children: N/A  . Years of Education: N/A   Occupational History  . assistant principal     [employer]   Social History Main Topics  . Smoking status: Never Smoker   . Smokeless tobacco: None  . Alcohol Use: No  . Drug Use: No  . Sexual Activity: No   Other Topics Concern  . None   Social History Narrative    Review of Systems: As mentioned in HPI   Physical Exam: BP 131/80 mmHg  Pulse 96  Temp(Src) 98.1 F (36.7 C)  Ht 5\' 10"  (1.778 m)  Wt 228 lb 3.2 oz (103.511 kg)  BMI 32.74 kg/m2 General:   Alert and oriented. No distress noted. Pleasant and cooperative.  Head:  Normocephalic and atraumatic. Eyes:  Conjuctiva clear without scleral  icterus. Mouth:  Oral mucosa pink and moist. Good dentition. No lesions. Abdomen:  +BS, soft, non-tender and non-distended. No rebound or guarding. No HSM or masses noted. Msk:  Symmetrical without gross deformities. Normal posture. Extremities:  Without edema. Neurologic:  Alert and  oriented x4;  grossly normal neurologically. Psych:  Alert and cooperative. Normal mood and affect.  Lab Results  Component Value Date   WBC 10.0 09/07/2015   HGB 11.7* 09/07/2015   HCT 36.3 09/07/2015   MCV 81.0 09/07/2015   PLT 237 09/07/2015

## 2015-10-04 NOTE — Assessment & Plan Note (Signed)
Etiology uncertain. Gallbladder remains in situ but no gallstones noted on imaging, nor any bumps in LFTs. EUS on file. Declining any further evaluation (i.e. Autoimmune testing, etc). However, she states she would consider this in the summer. She abstains from ETOH. She is doing well from a GI standpoint. She would also like to hold on any pancreatic enzymes at this point. Will have her return prn, as she would like to hold on further testing for now. She will consider pursuing this over the summer. Next colonoscopy in 2021.

## 2015-10-04 NOTE — Assessment & Plan Note (Signed)
Low iron but normal ferritin, Hgb now improved. TCS/EGD on file. Would not pursue significant work-up right now unless signs of worsening anemia. Followed closely by PCP. She is not taking iron currently and has declined this as well for right now. Next colonoscopy in 5 years.

## 2016-10-22 ENCOUNTER — Other Ambulatory Visit: Payer: Self-pay | Admitting: Family Medicine

## 2016-10-22 DIAGNOSIS — R109 Unspecified abdominal pain: Secondary | ICD-10-CM

## 2016-10-23 ENCOUNTER — Encounter (HOSPITAL_COMMUNITY): Payer: Self-pay

## 2016-10-23 ENCOUNTER — Observation Stay (HOSPITAL_COMMUNITY)
Admission: EM | Admit: 2016-10-23 | Discharge: 2016-10-26 | DRG: 440 | Disposition: A | Discharge status: Home / Self Care | Payer: BLUE CROSS/BLUE SHIELD | Attending: Internal Medicine | Admitting: Internal Medicine

## 2016-10-23 DIAGNOSIS — Z888 Allergy status to other drugs, medicaments and biological substances status: Secondary | ICD-10-CM | POA: Diagnosis not present

## 2016-10-23 DIAGNOSIS — E119 Type 2 diabetes mellitus without complications: Secondary | ICD-10-CM

## 2016-10-23 DIAGNOSIS — K8500 Idiopathic acute pancreatitis without necrosis or infection: Secondary | ICD-10-CM | POA: Diagnosis present

## 2016-10-23 DIAGNOSIS — Z794 Long term (current) use of insulin: Secondary | ICD-10-CM | POA: Diagnosis not present

## 2016-10-23 DIAGNOSIS — K8681 Exocrine pancreatic insufficiency: Secondary | ICD-10-CM | POA: Diagnosis present

## 2016-10-23 DIAGNOSIS — K861 Other chronic pancreatitis: Secondary | ICD-10-CM | POA: Diagnosis not present

## 2016-10-23 DIAGNOSIS — K859 Acute pancreatitis without necrosis or infection, unspecified: Secondary | ICD-10-CM | POA: Diagnosis present

## 2016-10-23 DIAGNOSIS — Z91040 Latex allergy status: Secondary | ICD-10-CM | POA: Diagnosis not present

## 2016-10-23 DIAGNOSIS — Z79899 Other long term (current) drug therapy: Secondary | ICD-10-CM

## 2016-10-23 DIAGNOSIS — Z886 Allergy status to analgesic agent status: Secondary | ICD-10-CM | POA: Diagnosis not present

## 2016-10-23 MED ORDER — ONDANSETRON HCL 4 MG/2ML IJ SOLN
4.0000 mg | Freq: Once | INTRAMUSCULAR | Status: AC
  Administered 2016-10-23: 4 mg via INTRAVENOUS
  Filled 2016-10-23: qty 2

## 2016-10-23 MED ORDER — HYDROMORPHONE HCL 1 MG/ML IJ SOLN
1.0000 mg | Freq: Once | INTRAMUSCULAR | Status: AC
  Administered 2016-10-23: 1 mg via INTRAVENOUS
  Filled 2016-10-23: qty 1

## 2016-10-23 MED ORDER — SODIUM CHLORIDE 0.9 % IV BOLUS (SEPSIS)
1000.0000 mL | Freq: Once | INTRAVENOUS | Status: AC
  Administered 2016-10-23: 1000 mL via INTRAVENOUS

## 2016-10-23 NOTE — ED Triage Notes (Signed)
Pt states she has hx of pancreatitis, states she has been hurting since Sunday and cannot tolerate it any more

## 2016-10-23 NOTE — ED Provider Notes (Signed)
[place] DEPT Provider Note   CSN: [id number] Arrival date & time: 10/23/16  2249  By signing my name below, I, Margit Banda, attest that this documentation has been prepared under the direction and in the presence of Ripley Fraise, MD. Electronically Signed: Margit Banda, ED Scribe. 10/23/16. 11:25 PM.   History   Chief Complaint Chief Complaint  Patient presents with  . Abdominal Pain    h/o pancreatitis    HPI Tara Kane is a 46 y.o. female with a PMHx of pancreatitis, who presents to the Emergency Department complaining of constant moderate epigastric abdominal pain since Sunday night. Associated sx include nausea and vomiting. Pt notes pain radiates to her lower back. Sx are similar to previous onsets. No h/o abdominal surgery. Pt denies fever, diarrhea, and dsyuria.    The history is provided by the patient. No language interpreter was used.  Abdominal Pain   This is a recurrent problem. The current episode started more than 2 days ago. The problem has not changed since onset.The pain is associated with eating. The pain is located in the epigastric region. The pain is moderate. Associated symptoms include nausea and vomiting. Pertinent negatives include fever, diarrhea and dysuria. Past workup includes ultrasound.    Past Medical History:  Diagnosis Date  . Anemia   . Diabetes mellitus without complication ([place])    15 yrs Lantus use.  Marland Kitchen Hx of seasonal allergies   . Pancreatitis, acute 09/29/2012, Nov 2016    Patient Active Problem List   Diagnosis Date Noted  . Chronic pancreatitis ([place]) 10/04/2015  . Ileus, unspecified 06/04/2015  . Normocytic anemia 06/01/2015  . Type 2 diabetes mellitus without complication, with long-term current use of insulin ([place]) 06/01/2015  . Leukocytosis 05/31/2015  . Idiopathic acute pancreatitis without infection or necrosis   . Diabetes mellitus without complication ([place])   . Constipation 10/01/2012  . Seasonal  allergies 09/30/2012  . Hyponatremia 09/30/2012  . Pancreatitis, acute 09/29/2012  . Obesity, unspecified 09/29/2012    Past Surgical History:  Procedure Laterality Date  . COLONOSCOPY N/A 08/14/2015   [facility]:AJOINOMVE colon/single colonic polyp (tubular adenoma). Surveillance in 5 years  . ESOPHAGOGASTRODUODENOSCOPY N/A 08/14/2015   [facility]:NOBSJG  . EUS  09/2015   Dr. Ardis Hughs: chronic pancreatitis  . EUS N/A 09/28/2015   Procedure: UPPER ENDOSCOPIC ULTRASOUND (EUS) LINEAR;  Surgeon: Milus Banister, MD;  Location: [facility] ENDOSCOPY;  Service: Endoscopy;  Laterality: N/A;  . EYE SURGERY      OB History    No data available       Home Medications    Prior to Admission medications   Medication Sig Start Date End Date Taking? Authorizing Provider  acetaminophen (TYLENOL) 500 MG tablet Take 500 mg by mouth every 6 (six) hours as needed for mild pain or moderate pain.    Historical Provider, MD  fexofenadine (ALLEGRA) 180 MG tablet Take 180 mg by mouth daily.    Historical Provider, MD  Insulin Glargine (LANTUS SOLOSTAR) 100 UNIT/ML Solostar Pen Inject 32 Units into the skin at bedtime.     Historical Provider, MD  metFORMIN (GLUCOPHAGE-XR) 500 MG 24 hr tablet Take 1,000 mg by mouth 2 (two) times daily.  04/05/15   Historical Provider, MD  Multiple Vitamin (MULTIVITAMIN) tablet Take 1 tablet by mouth daily. Reported on 07/19/2015    Historical Provider, MD    Family History Family History  Problem Relation Age of Onset  . Diabetes Father   . Polymyalgia rheumatica Mother   .  Sarcoidosis Mother   . Colon cancer Neg Hx     Social History Social History  Substance Use Topics  . Smoking status: Never Smoker  . Smokeless tobacco: Never Used  . Alcohol use No     Allergies   Aspirin; Ibuprofen; Latex; and Other   Review of Systems Review of Systems  Constitutional: Negative for fever.  Cardiovascular: Negative for chest pain.  Gastrointestinal: Positive for abdominal pain, nausea and  vomiting. Negative for diarrhea.  Genitourinary: Negative for dysuria.  All other systems reviewed and are negative.    Physical Exam Updated Vital Signs BP 132/87 (BP Location: Right Arm)   Pulse 86   Temp 98.5 F (36.9 C) (Oral)   Resp 16   Ht 5\' 10"  (1.778 m)   Wt 220 lb (99.8 kg)   SpO2 100%   BMI 31.57 kg/m   Physical Exam  CONSTITUTIONAL: Well developed/well nourished, uncomfortable appearing HEAD: Normocephalic/atraumatic EYES: EOMI/PERRL. No icterus. ENMT: Mucous membranes moist NECK: supple no meningeal signs SPINE/BACK:entire spine nontender CV: S1/S2 noted, no murmurs/rubs/gallops noted LUNGS: Lungs are clear to auscultation bilaterally, no apparent distress ABDOMEN: soft, moderate epigastric tenderness, no rebound or guarding, bowel sounds noted throughout abdomen GU:no cva tenderness NEURO: Pt is awake/alert/appropriate, moves all extremitiesx4.  No facial droop.   EXTREMITIES: pulses normal/equal, full ROM SKIN: warm, color normal PSYCH: no abnormalities of mood noted, alert and oriented to situation   ED Treatments / Results  DIAGNOSTIC STUDIES: Oxygen Saturation is 100% on RA, normal by my interpretation.   COORDINATION OF CARE: 11:25 PM-Discussed next steps with pt. Pt verbalized understanding and is agreeable with the plan.    Labs (all labs ordered are listed, but only abnormal results are displayed) Labs Reviewed  COMPREHENSIVE METABOLIC PANEL - Abnormal; Notable for the following:       Result Value   Sodium 133 (*)    Glucose, Bld 118 (*)    AST 12 (*)    ALT 12 (*)    Total Bilirubin 0.2 (*)    All other components within normal limits  CBC WITH DIFFERENTIAL/PLATELET - Abnormal; Notable for the following:    Hemoglobin 11.4 (*)    HCT 34.2 (*)    All other components within normal limits  LIPASE, BLOOD - Abnormal; Notable for the following:    Lipase 70 (*)    All other components within normal limits  URINALYSIS, ROUTINE W  REFLEX MICROSCOPIC  POC URINE PREG, ED    EKG  EKG Interpretation  Date/Time:  Thursday October 24 2016 00:13:54 EDT Ventricular Rate:  78 PR Interval:    QRS Duration: 101 QT Interval:  390 QTC Calculation: 445 R Axis:   22 Text Interpretation:  Sinus rhythm Borderline T wave abnormalities No previous ECGs available Confirmed by Christy Gentles  MD, Deisi Salonga ([id number]) on 10/24/2016 12:20:48 AM       Radiology No results found.  Procedures Procedures (including critical care time)  Medications Ordered in ED Medications  HYDROmorphone (DILAUDID) injection 1 mg (1 mg Intravenous Given 10/23/16 2355)  ondansetron (ZOFRAN) injection 4 mg (4 mg Intravenous Given 10/23/16 2355)  sodium chloride 0.9 % bolus 1,000 mL (0 mLs Intravenous Stopped 10/24/16 0059)  HYDROmorphone (DILAUDID) injection 1 mg (1 mg Intravenous Given 10/24/16 0149)     Initial Impression / Assessment and Plan / ED Course  I have reviewed the triage vital signs and the nursing notes.  Pertinent labs   results that were available during my care of the  patient were reviewed by me and considered in my medical decision making (see chart for details).     1:59 AM Pt with h/o chronic pancreatitis here with worsening flare of pain in past several days Lipase is elevated It has been deemed idiopathic in the past She is still having pain at this time She has required admission/pain control and clear fluids in hospital previously D/w dr Darrick Meigs for admission   Final Clinical Impressions(s) / ED Diagnoses   Final diagnoses:  Idiopathic acute pancreatitis without infection or necrosis    New Prescriptions New Prescriptions   No medications on file   I personally performed the services described in this documentation, which was scribed in my presence. The recorded information has been reviewed and is accurate.       Ripley Fraise, MD 10/24/16 0200

## 2016-10-24 ENCOUNTER — Encounter (HOSPITAL_COMMUNITY): Payer: Self-pay | Admitting: *Deleted

## 2016-10-24 DIAGNOSIS — K8500 Idiopathic acute pancreatitis without necrosis or infection: Secondary | ICD-10-CM

## 2016-10-24 DIAGNOSIS — Z794 Long term (current) use of insulin: Secondary | ICD-10-CM | POA: Diagnosis not present

## 2016-10-24 DIAGNOSIS — K859 Acute pancreatitis without necrosis or infection, unspecified: Secondary | ICD-10-CM | POA: Diagnosis present

## 2016-10-24 DIAGNOSIS — E119 Type 2 diabetes mellitus without complications: Secondary | ICD-10-CM

## 2016-10-24 DIAGNOSIS — K851 Biliary acute pancreatitis without necrosis or infection: Secondary | ICD-10-CM

## 2016-10-24 LAB — GLUCOSE, CAPILLARY
GLUCOSE-CAPILLARY: 81 mg/dL (ref 65–99)
GLUCOSE-CAPILLARY: 98 mg/dL (ref 65–99)
GLUCOSE-CAPILLARY: 102 mg/dL — AB (ref 65–99)
Glucose-Capillary: 145 mg/dL — ABNORMAL HIGH (ref 65–99)

## 2016-10-24 LAB — COMPREHENSIVE METABOLIC PANEL
ALK PHOS: 58 U/L (ref 38–126)
ALT: 10 U/L — ABNORMAL LOW (ref 14–54)
ALT: 12 U/L — ABNORMAL LOW (ref 14–54)
ANION GAP: 5 (ref 5–15)
ANION GAP: 5 (ref 5–15)
AST: 12 U/L — ABNORMAL LOW (ref 15–41)
AST: 13 U/L — ABNORMAL LOW (ref 15–41)
Albumin: 3.3 g/dL — ABNORMAL LOW (ref 3.5–5.0)
Albumin: 3.8 g/dL (ref 3.5–5.0)
Alkaline Phosphatase: 50 U/L (ref 38–126)
BUN: 6 mg/dL (ref 6–20)
BUN: 6 mg/dL (ref 6–20)
CALCIUM: 9.2 mg/dL (ref 8.9–10.3)
CHLORIDE: 106 mmol/L (ref 101–111)
CO2: 26 mmol/L (ref 22–32)
CO2: 27 mmol/L (ref 22–32)
Calcium: 8.8 mg/dL — ABNORMAL LOW (ref 8.9–10.3)
Chloride: 101 mmol/L (ref 101–111)
Creatinine, Ser: 0.48 mg/dL (ref 0.44–1.00)
Creatinine, Ser: 0.52 mg/dL (ref 0.44–1.00)
GFR calc Af Amer: >60 mL/min (ref >60)
GFR calc non Af Amer: >60 mL/min (ref >60)
Glucose, Bld: 113 mg/dL — ABNORMAL HIGH (ref 65–99)
Glucose, Bld: 118 mg/dL — ABNORMAL HIGH (ref 65–99)
POTASSIUM: 3.7 mmol/L (ref 3.5–5.1)
POTASSIUM: 3.5 mmol/L (ref 3.5–5.1)
SODIUM: 133 mmol/L — AB (ref 135–145)
Sodium: 137 mmol/L (ref 135–145)
TOTAL PROTEIN: 7.9 g/dL (ref 6.5–8.1)
Total Bilirubin: 0.2 mg/dL — ABNORMAL LOW (ref 0.3–1.2)
Total Bilirubin: 0.2 mg/dL — ABNORMAL LOW (ref 0.3–1.2)
Total Protein: 6.8 g/dL (ref 6.5–8.1)

## 2016-10-24 LAB — URINALYSIS, ROUTINE W REFLEX MICROSCOPIC
Bilirubin Urine: NEGATIVE
Glucose, UA: NEGATIVE mg/dL
Hgb urine dipstick: NEGATIVE
Ketones, ur: NEGATIVE mg/dL
LEUKOCYTES UA: NEGATIVE
NITRITE: NEGATIVE
Protein, ur: NEGATIVE mg/dL
SPECIFIC GRAVITY, URINE: 1.014 (ref 1.005–1.030)
pH: 6 (ref 5.0–8.0)

## 2016-10-24 LAB — CBC WITH DIFFERENTIAL/PLATELET
BASOS ABS: 0 10*3/uL (ref 0.0–0.1)
BASOS PCT: 0 %
Eosinophils Absolute: 0.1 10*3/uL (ref 0.0–0.7)
Eosinophils Relative: 1 %
HEMATOCRIT: 34.2 % — AB (ref 36.0–46.0)
HEMOGLOBIN: 11.4 g/dL — AB (ref 12.0–15.0)
Lymphocytes Relative: 20 %
Lymphs Abs: 2 10*3/uL (ref 0.7–4.0)
MCH: 26.7 pg (ref 26.0–34.0)
MCHC: 33.3 g/dL (ref 30.0–36.0)
MCV: 80.1 fL (ref 78.0–100.0)
MONOS PCT: 4 %
Monocytes Absolute: 0.4 10*3/uL (ref 0.1–1.0)
NEUTROS ABS: 7.4 10*3/uL (ref 1.7–7.7)
Neutrophils Relative %: 75 %
Platelets: 227 10*3/uL (ref 150–400)
RBC: 4.27 MIL/uL (ref 3.87–5.11)
RDW: 14.9 % (ref 11.5–15.5)
WBC: 9.8 10*3/uL (ref 4.0–10.5)

## 2016-10-24 LAB — CBC
HEMATOCRIT: 32.5 % — AB (ref 36.0–46.0)
HEMOGLOBIN: 10.5 g/dL — AB (ref 12.0–15.0)
MCH: 26.1 pg (ref 26.0–34.0)
MCHC: 32.3 g/dL (ref 30.0–36.0)
MCV: 80.6 fL (ref 78.0–100.0)
PLATELETS: 204 10*3/uL (ref 150–400)
RBC: 4.03 MIL/uL (ref 3.87–5.11)
RDW: 15 % (ref 11.5–15.5)
WBC: 9.4 10*3/uL (ref 4.0–10.5)

## 2016-10-24 LAB — LIPASE, BLOOD: Lipase: 70 U/L — ABNORMAL HIGH (ref 11–51)

## 2016-10-24 LAB — MRSA PCR SCREENING: MRSA BY PCR: NEGATIVE

## 2016-10-24 LAB — POC URINE PREG, ED: PREG TEST UR: NEGATIVE

## 2016-10-24 MED ORDER — ONDANSETRON HCL 4 MG PO TABS: 4.0000 mg | ORAL_TABLET | Freq: Four times a day (QID) | ORAL | Status: DC | PRN

## 2016-10-24 MED ORDER — SODIUM CHLORIDE 0.9 % IV SOLN
INTRAVENOUS | Status: DC
  Administered 2016-10-24 – 2016-10-26 (×7): via INTRAVENOUS

## 2016-10-24 MED ORDER — HYDROMORPHONE HCL 1 MG/ML IJ SOLN
1.0000 mg | Freq: Once | INTRAMUSCULAR | Status: AC
  Administered 2016-10-24: 1 mg via INTRAVENOUS
  Filled 2016-10-24: qty 1

## 2016-10-24 MED ORDER — HYDROMORPHONE HCL 1 MG/ML IJ SOLN
1.0000 mg | INTRAMUSCULAR | Status: DC | PRN
  Administered 2016-10-24 – 2016-10-26 (×9): 1 mg via INTRAVENOUS
  Filled 2016-10-24 (×9): qty 1

## 2016-10-24 MED ORDER — ACETAMINOPHEN 500 MG PO TABS
500.0000 mg | ORAL_TABLET | Freq: Four times a day (QID) | ORAL | Status: DC | PRN
  Administered 2016-10-24 (×2): 500 mg via ORAL
  Filled 2016-10-24 (×2): qty 1

## 2016-10-24 MED ORDER — ONDANSETRON HCL 4 MG/2ML IJ SOLN
4.0000 mg | Freq: Four times a day (QID) | INTRAMUSCULAR | Status: DC | PRN
  Administered 2016-10-24 – 2016-10-25 (×3): 4 mg via INTRAVENOUS
  Filled 2016-10-24 (×3): qty 2

## 2016-10-24 MED ORDER — INSULIN ASPART 100 UNIT/ML ~~LOC~~ SOLN
0.0000 [IU] | Freq: Three times a day (TID) | SUBCUTANEOUS | Status: DC
  Administered 2016-10-25 – 2016-10-26 (×3): 1 [IU] via SUBCUTANEOUS

## 2016-10-24 MED ORDER — ENOXAPARIN SODIUM 40 MG/0.4ML ~~LOC~~ SOLN
40.0000 mg | SUBCUTANEOUS | Status: DC
  Administered 2016-10-24 – 2016-10-26 (×3): 40 mg via SUBCUTANEOUS
  Filled 2016-10-24 (×3): qty 0.4

## 2016-10-24 NOTE — Progress Notes (Signed)
Patient reported having worse pain and ABD discomfort after eating soft diet at lunch today. MD notified and made aware. New order given to change patient's diet to full liquid. Patient is med-surg and will be transferring to Dept 300 room# 308. Report given to [facility], LPN on Dept 320.

## 2016-10-24 NOTE — H&P (Signed)
[facility] H&P    Patient Demographics:    Tara Kane, is a 46 y.o. female  MRN: [id number]  DOB - 08-Apr-1971  Admit Date - 10/23/2016  Referring MD/NP/PA: Dr Christy Gentles  Outpatient Primary MD for the patient is Maggie Font, MD  Patient coming from: Home   Chief Complaint  Patient presents with  . Abdominal Pain    h/o pancreatitis      HPI:    Tara Kane  is a 46 y.o. female, With history of pancreatitis who came to ED with epigastric abdominal pain since Sunday. Also had sedated nausea but no vomiting. Patient says that pain is 8/10 intensity radiates to back. Patient had 2 previous episodes of pancreatitis. She had extensive workup for cause of pancreatitis as outpatient and was deemed as idiopathic.  She denies chest pain or shortness of breath. No fever or dysuria. In the ED lipase was 70.    Review of systems:    In addition to the HPI above,  No Fever-chills, No Headache, No changes with Vision or hearing, No problems swallowing food or Liquids, No Blood in stool or Urine, No dysuria, No new skin rashes or bruises, No new joints pains-aches,  No new weakness, tingling, numbness in any extremity, No recent weight gain or loss, No polyuria, polydypsia or polyphagia, No significant Mental Stressors.  A full 10 point Review of Systems was done, except as stated above, all other Review of Systems were negative.   With Past History of the following :    Past Medical History:  Diagnosis Date  . Anemia   . Diabetes mellitus without complication ([place])    15 yrs Lantus use.  Marland Kitchen Hx of seasonal allergies   . Pancreatitis, acute 09/29/2012, Nov 2016      Past Surgical History:  Procedure Laterality Date  . COLONOSCOPY N/A 08/14/2015   [facility]:WJXBJYNWG colon/single colonic polyp (tubular adenoma). Surveillance in 5 years  . ESOPHAGOGASTRODUODENOSCOPY N/A 08/14/2015   NFA:OZHYQM  . EUS   09/2015   Dr. Ardis Hughs: chronic pancreatitis  . EUS N/A 09/28/2015   Procedure: UPPER ENDOSCOPIC ULTRASOUND (EUS) LINEAR;  Surgeon: Milus Banister, MD;  Location: [facility] END[facility];  Service: Endoscopy;  Laterality: N/A;  . EYE SURGERY        Social History:      Social History  Substance Use Topics  . Smoking status: Never Smoker  . Smokeless tobacco: Never Used  . Alcohol use No       Family History :     Family History  Problem Relation Age of Onset  . Diabetes Father   . Polymyalgia rheumatica Mother   . Sarcoidosis Mother   . Colon cancer Neg Hx       Home Medications:   Prior to Admission medications   Medication Sig Start Date End Date Taking? Authorizing Provider  acetaminophen (TYLENOL) 500 MG tablet Take 500 mg by mouth every 6 (six) hours as needed for mild pain or moderate pain.    Historical Provider, MD  fexofenadine (ALLEGRA) 180 MG tablet  Take 180 mg by mouth daily.    Historical Provider, MD  Insulin Glargine (LANTUS SOLOSTAR) 100 UNIT/ML Solostar Pen Inject 32 Units into the skin at bedtime.     Historical Provider, MD  metFORMIN (GLUCOPHAGE-XR) 500 MG 24 hr tablet Take 1,000 mg by mouth 2 (two) times daily.  04/05/15   Historical Provider, MD  Multiple Vitamin (MULTIVITAMIN) tablet Take 1 tablet by mouth daily. Reported on 07/19/2015    Historical Provider, MD     Allergies:     Allergies  Allergen Reactions  . Aspirin Anaphylaxis and Swelling  . Ibuprofen Anaphylaxis and Swelling  . Latex Swelling    Facial   . Other Swelling    Muscle Relaxers Cause Swelling.      Physical Exam:   Vitals  Blood pressure 107/70, pulse 82, temperature 98.5 F (36.9 C), temperature source Oral, resp. rate 17, height 5\' 10"  (1.778 m), weight 99.8 kg (220 lb), SpO2 95 %.  1.  General: Appears in no acute distress  2. Psychiatric:  Intact judgement and  insight, awake alert, oriented x 3.  3. Neurologic: No focal neurological deficits, all cranial nerves  intact.Strength 5/5 all 4 extremities, sensation intact all 4 extremities, plantars down going.  4. Eyes :  anicteric sclerae, moist conjunctivae with no lid lag. PERRLA.  5. ENMT:  Oropharynx clear with moist mucous membranes and good dentition  6. Neck:  supple, no cervical lymphadenopathy appriciated, No thyromegaly  7. Respiratory : Normal respiratory effort, good air movement bilaterally,clear to  auscultation bilaterally  8. Cardiovascular : RRR, no gallops, rubs or murmurs, no leg edema  9. Gastrointestinal:  Positive bowel sounds, abdomen soft, positive epigastric tenderness to palpation, no hepatosplenomegaly, no rigidity or guarding       10. Skin:  No cyanosis, normal texture and turgor, no rash, lesions or ulcers  11.Musculoskeletal:  Good muscle tone,  joints appear normal , no effusions,  normal range of motion    Data Review:    CBC  Recent Labs Lab 10/24/16 0001  WBC 9.8  HGB 11.4*  HCT 34.2*  PLT 227  MCV 80.1  MCH 26.7  MCHC 33.3  RDW 14.9  LYMPHSABS 2.0  MONOABS 0.4  EOSABS 0.1  BASOSABS 0.0   ------------------------------------------------------------------------------------------------------------------  Chemistries   Recent Labs Lab 10/24/16 0001  NA 133*  K 3.5  CL 101  CO2 27  GLUCOSE 118*  BUN 6  CREATININE 0.52  CALCIUM 9.2  AST 12*  ALT 12*  ALKPHOS 58  BILITOT 0.2*   ------------------------------------------------------------------------------------------------------------------  ------------------------------------------------------------------------------------------------------------------ GFR: Estimated Creatinine Clearance: 112.4 mL/min (by C-G formula based on SCr of 0.52 mg/dL). Liver Function Tests:  Recent Labs Lab 10/24/16 0001  AST 12*  ALT 12*  ALKPHOS 58  BILITOT 0.2*  PROT 7.9  ALBUMIN 3.8    Recent Labs Lab 10/24/16 0001  LIPASE 70*     --------------------------------------------------------------------------------------------------------------- Urine analysis:    Component Value Date/Time   COLORURINE YELLOW 10/23/2016 0100   APPEARANCEUR CLEAR 10/23/2016 0100   LABSPEC 1.014 10/23/2016 0100   PHURINE 6.0 10/23/2016 0100   GLUCOSEU NEGATIVE 10/23/2016 0100   HGBUR NEGATIVE 10/23/2016 0100   BILIRUBINUR NEGATIVE 10/23/2016 0100   KETONESUR NEGATIVE 10/23/2016 0100   PROTEINUR NEGATIVE 10/23/2016 0100   UROBILINOGEN 0.2 05/29/2015 2000   NITRITE NEGATIVE 10/23/2016 0100   LEUKOCYTESUR NEGATIVE 10/23/2016 0100      Imaging Results:    No results found.  My personal review of EKG: Rhythm NSR  Assessment & Plan:    Active Problems:   Idiopathic acute pancreatitis without infection or necrosis   Diabetes mellitus without complication (H[facility])   Type 2 diabetes mellitus without complication, with long-term current use of insulin (H[facility])   Acute pancreatitis   1. Acute on chronic pancreatitis- we will place under observation for pain control, continue Dilaudid 1 mg every 4 hours when necessary. Start IV normal saline at 125 ML per hour. We will keep patient nothing by mouth. 2. Diabetes mellitus- Hold Lantus and metformin, will start sliding scale insulin with NovoLog.    DVT Prophylaxis-   Lovenox   AM Labs Ordered, also please review Full Orders  Family Communication: Admission, patients condition and plan of care including tests being ordered have been discussed with the patient  who indicate understanding and agree with the plan and Code Status.  Code Status:  Full code  Admission status: Observation  Time spent in minutes : 50 minutes   Saraphina Lauderbaugh S M.D on 10/24/2016 at 2:11 AM  Between 7am to 7pm - Pager - [phone]. After 7pm go to [email or web address] - password [facility]

## 2016-10-24 NOTE — Progress Notes (Signed)
Patient seen and examined, database reviewed. No family members at bedside. Patient admitted earlier today for repeat episode of acute pancreatitis that failed to resolve at home after 48 hours. Attempted to advance diet today in hopes for discharge later today, however she had immediate abdominal pain after diet, have downgraded diet again and will delay discharge for now. Will continue to follow.  Domingo Mend, MD [facility] Pager: [phone]

## 2016-10-25 DIAGNOSIS — K851 Biliary acute pancreatitis without necrosis or infection: Secondary | ICD-10-CM | POA: Diagnosis not present

## 2016-10-25 LAB — GLUCOSE, CAPILLARY
Glucose-Capillary: 122 mg/dL — ABNORMAL HIGH (ref 65–99)
Glucose-Capillary: 143 mg/dL — ABNORMAL HIGH (ref 65–99)
Glucose-Capillary: 121 mg/dL — ABNORMAL HIGH (ref 65–99)
Glucose-Capillary: 138 mg/dL — ABNORMAL HIGH (ref 65–99)

## 2016-10-25 LAB — HEMOGLOBIN A1C
HEMOGLOBIN A1C: 6.9 % — AB (ref 4.8–5.6)
Mean Plasma Glucose: 151 mg/dL

## 2016-10-25 LAB — HIV ANTIBODY (ROUTINE TESTING W REFLEX): HIV Screen 4th Generation wRfx: NONREACTIVE

## 2016-10-25 NOTE — Progress Notes (Signed)
PROGRESS NOTE    Tara Kane  DGL:[id number] DOB: 11/02/1970 DOA: 10/23/2016 PCP: Maggie Font, MD     Brief Narrative:  46 y/o woman admitted on 4/4 due to recurrent acute pancreatitis. Advancing diet on 4/6.   Assessment & Plan:   Active Problems:   Idiopathic acute pancreatitis without infection or necrosis   Diabetes mellitus without complication (HCC)   Type 2 diabetes mellitus without complication, with long-term current use of insulin (HCC)   Acute pancreatitis   Acute Pancreatitis -Pain is improving, altho had some worsening today after a carbonated beverage. -Plan to advance diet, if tolerates will DC home in am.  DM -Fair control. -No medication changes today.   DVT prophylaxis: lovenox Code Status: full code Family Communication: patient only Disposition Plan: anticipate DC home in 24-48 hours  Consultants:   None  Procedures:   None  Antimicrobials:  Anti-infectives    None       Subjective: Feels improved, some abdominal pain after ingesting a coke today. Would like her diet advanced  Objective: Vitals:   10/24/16 1410 10/24/16 1956 10/24/16 2132 10/25/16 0510  BP: (!) 112/54  124/63 118/73  Pulse: 72  85 83  Resp: 16  16 16   Temp: 98.3 F (36.8 C)  97.5 F (36.4 C) 97.9 F (36.6 C)  TempSrc: Oral  Oral Oral  SpO2: 97% 98% 98% 98%  Weight:      Height:        Intake/Output Summary (Last 24 hours) at 10/25/16 1634 Last data filed at 10/25/16 1300  Gross per 24 hour  Intake              840 ml  Output                0 ml  Net              840 ml   Filed Weights   10/23/16 2259 10/24/16 0251  Weight: 99.8 kg (220 lb) 101.3 kg (223 lb 5.2 oz)    Examination:  General exam: Alert, awake, oriented x 3 Respiratory system: Clear to auscultation. Respiratory effort normal. Cardiovascular system:RRR. No murmurs, rubs, gallops. Gastrointestinal system: Abdomen is nondistended, soft and nontender. No organomegaly or masses  felt. Normal bowel sounds heard. Central nervous system: Alert and oriented. No focal neurological deficits. Extremities: No C/C/E, +pedal pulses Skin: No rashes, lesions or ulcers Psychiatry: Judgement and insight appear normal. Mood & affect appropriate.     Data Reviewed: I have personally reviewed following labs and imaging studies  CBC:  Recent Labs Lab 10/24/16 0001 10/24/16 0441  WBC 9.8 9.4  NEUTROABS 7.4  --   HGB 11.4* 10.5*  HCT 34.2* 32.5*  MCV 80.1 80.6  PLT 227 518   Basic Metabolic Panel:  Recent Labs Lab 10/24/16 0001 10/24/16 0441  NA 133* 137  K 3.5 3.7  CL 101 106  CO2 27 26  GLUCOSE 118* 113*  BUN 6 6  CREATININE 0.52 0.48  CALCIUM 9.2 8.8*   GFR: Estimated Creatinine Clearance: 113.2 mL/min (by C-G formula based on SCr of 0.48 mg/dL). Liver Function Tests:  Recent Labs Lab 10/24/16 0001 10/24/16 0441  AST 12* 13*  ALT 12* 10*  ALKPHOS 58 50  BILITOT 0.2* 0.2*  PROT 7.9 6.8  ALBUMIN 3.8 3.3*    Recent Labs Lab 10/24/16 0001  LIPASE 70*   No results for input(s): AMMONIA in the last 168 hours. Coagulation Profile: No results for  input(s): INR, PROTIME in the last 168 hours. Cardiac Enzymes: No results for input(s): CKTOTAL, CKMB, CKMBINDEX, TROPONINI in the last 168 hours. BNP (last 3 results) No results for input(s): PROBNP in the last 8760 hours. HbA1C:  Recent Labs  10/24/16 0001  HGBA1C 6.9*   CBG:  Recent Labs Lab 10/24/16 1107 10/24/16 1712 10/24/16 2138 10/25/16 0742 10/25/16 1102  GLUCAP 98 102* 145* 122* 143*   Lipid Profile: No results for input(s): CHOL, HDL, LDLCALC, TRIG, CHOLHDL, LDLDIRECT in the last 72 hours. Thyroid Function Tests: No results for input(s): TSH, T4TOTAL, FREET4, T3FREE, THYROIDAB in the last 72 hours. Anemia Panel: No results for input(s): VITAMINB12, FOLATE, FERRITIN, TIBC, IRON, RETICCTPCT in the last 72 hours. Urine analysis:    Component Value Date/Time   COLORURINE  YELLOW 10/23/2016 0100   APPEARANCEUR CLEAR 10/23/2016 0100   LABSPEC 1.014 10/23/2016 0100   PHURINE 6.0 10/23/2016 0100   GLUCOSEU NEGATIVE 10/23/2016 0100   HGBUR NEGATIVE 10/23/2016 0100   BILIRUBINUR NEGATIVE 10/23/2016 0100   KETONESUR NEGATIVE 10/23/2016 0100   PROTEINUR NEGATIVE 10/23/2016 0100   UROBILINOGEN 0.2 05/29/2015 2000   NITRITE NEGATIVE 10/23/2016 0100   LEUKOCYTESUR NEGATIVE 10/23/2016 0100   Sepsis Labs: @LABRCNTIP (procalcitonin:4,lacticidven:4)  ) Recent Results (from the past 240 hour(s))  MRSA PCR Screening     Status: None   Collection Time: 10/24/16  2:44 AM  Result Value Ref Range Status   MRSA by PCR NEGATIVE NEGATIVE Final    Comment:        The GeneXpert MRSA Assay (FDA approved for NASAL specimens only), is one component of a comprehensive MRSA colonization surveillance program. It is not intended to diagnose MRSA infection nor to guide or monitor treatment for MRSA infections.          Radiology Studies: No results found.      Scheduled Meds: . enoxaparin (LOVENOX) injection  40 mg Subcutaneous Q24H  . insulin aspart  0-9 Units Subcutaneous TID WC   Continuous Infusions: . sodium chloride 125 mL/hr at 10/25/16 1013     LOS: 1 day    Time spent: 25 minutes. Greater than 50% of this time was spent in direct contact with the patient coordinating care.     Lelon Frohlich, MD [facility] Pager [phone]  If 7PM-7AM, please contact night-coverage [email or web address] Password [facility] 10/25/2016, 4:34 PM

## 2016-10-25 NOTE — Care Management Note (Signed)
Case Management Note  Patient Details  Name: Tara Kane MRN: [id number] Date of Birth: Oct 17, 1970  Subjective/Objective:   Adm with acute pancreatitis. Chart reviewed for needs. Patient from home, ind with ADL's, has insurance with prescription coverage.               Action/Plan: Anticipate DC home with self care. No CM needs known.   Expected Discharge Date:      10/26/2016            Expected Discharge Plan:  Home/Self Care  In-House Referral:     Discharge planning Services  CM Consult  Post Acute Care Choice:    Choice offered to:  NA  DME Arranged:    DME Agency:     HH Arranged:    HH Agency:     Status of Service:  Completed, signed off  If discussed at [employer] of Stay Meetings, dates discussed:    Additional Comments:  Zohan Shiflet, Chauncey Reading, RN 10/25/2016, 9:15 AM

## 2016-10-26 DIAGNOSIS — K851 Biliary acute pancreatitis without necrosis or infection: Secondary | ICD-10-CM | POA: Diagnosis not present

## 2016-10-26 LAB — GLUCOSE, CAPILLARY
Glucose-Capillary: 144 mg/dL — ABNORMAL HIGH (ref 65–99)
Glucose-Capillary: 111 mg/dL — ABNORMAL HIGH (ref 65–99)

## 2016-10-26 LAB — BASIC METABOLIC PANEL
Anion gap: 5 (ref 5–15)
BUN: 5 mg/dL — AB (ref 6–20)
CHLORIDE: 106 mmol/L (ref 101–111)
CO2: 26 mmol/L (ref 22–32)
CREATININE: 0.46 mg/dL (ref 0.44–1.00)
Calcium: 8.8 mg/dL — ABNORMAL LOW (ref 8.9–10.3)
GFR calc Af Amer: >60 mL/min (ref >60)
GFR calc non Af Amer: >60 mL/min (ref >60)
Glucose, Bld: 132 mg/dL — ABNORMAL HIGH (ref 65–99)
POTASSIUM: 4 mmol/L (ref 3.5–5.1)
Sodium: 137 mmol/L (ref 135–145)

## 2016-10-26 LAB — CBC
HEMATOCRIT: 31.3 % — AB (ref 36.0–46.0)
Hemoglobin: 10.3 g/dL — ABNORMAL LOW (ref 12.0–15.0)
MCH: 26.4 pg (ref 26.0–34.0)
MCHC: 32.9 g/dL (ref 30.0–36.0)
MCV: 80.3 fL (ref 78.0–100.0)
PLATELETS: 202 10*3/uL (ref 150–400)
RBC: 3.9 MIL/uL (ref 3.87–5.11)
RDW: 14.9 % (ref 11.5–15.5)
WBC: 8.3 10*3/uL (ref 4.0–10.5)

## 2016-10-26 MED ORDER — OXYCODONE HCL 5 MG PO TABS: 5.0000 mg | ORAL_TABLET | ORAL | 0 refills | Status: DC | PRN

## 2016-10-26 MED ORDER — POLYETHYLENE GLYCOL 3350 17 G PO PACK
17.0000 g | PACK | Freq: Every day | ORAL | Status: DC | PRN
  Administered 2016-10-26 (×2): 17 g via ORAL
  Filled 2016-10-26 (×2): qty 1

## 2016-10-26 NOTE — Discharge Summary (Signed)
Physician Discharge Summary  Tara Kane BSJ:[id number] DOB: 12/09/1970 DOA: 10/23/2016  PCP: Maggie Font, MD  Admit date: 10/23/2016 Discharge date: 10/26/2016  Time spent: 45 minutes  Recommendations for Outpatient Follow-up:  -Will be discharged home today. -Advised to follow up with PCP in 2 weeks.   Discharge Diagnoses:  Active Problems:   Idiopathic acute pancreatitis without infection or necrosis   Diabetes mellitus without complication ([place])   Type 2 diabetes mellitus without complication, with long-term current use of insulin ([place])   Acute pancreatitis   Discharge Condition: Stable and improved  Filed Weights   10/23/16 2259 10/24/16 0251  Weight: 99.8 kg (220 lb) 101.3 kg (223 lb 5.2 oz)    History of present illness:  As per Dr. Darrick Meigs on 4/5: Tara Kane  is a 46 y.o. female, With history of pancreatitis who came to ED with epigastric abdominal pain since Sunday. Also had sedated nausea but no vomiting. Patient says that pain is 8/10 intensity radiates to back. Patient had 2 previous episodes of pancreatitis. She had extensive workup for cause of pancreatitis as outpatient and was deemed as idiopathic.  She denies chest pain or shortness of breath. No fever or dysuria. In the ED lipase was 70.  Hospital Course:   Acute Pancreatitis -Pain is improving, has tolerated a diet without issues. -Plan to DC home today. -Have given #15 tablets of oxycodone.  DM -Fair control. -No medication changes.   Procedures:  None   Consultations:  None  Discharge Instructions  Discharge Instructions    Diet - low sodium heart healthy    Complete by:  As directed    Increase activity slowly    Complete by:  As directed      Allergies as of 10/26/2016      Reactions   Aspirin Anaphylaxis, Swelling   Ibuprofen Anaphylaxis, Swelling   Latex Swelling   Facial    Other Swelling   Muscle Relaxers Cause Swelling.       Medication List    TAKE  these medications   acetaminophen 500 MG tablet Commonly known as:  TYLENOL Take 500 mg by mouth every 6 (six) hours as needed for mild pain or moderate pain.   fexofenadine 180 MG tablet Commonly known as:  ALLEGRA Take 180 mg by mouth daily.   LANTUS SOLOSTAR 100 UNIT/ML Solostar Pen Generic drug:  Insulin Glargine Inject 32 Units into the skin at bedtime.   metFORMIN 500 MG 24 hr tablet Commonly known as:  GLUCOPHAGE-XR Take 1,000 mg by mouth 2 (two) times daily.   multivitamin tablet Take 1 tablet by mouth daily. Reported on 07/19/2015   oxyCODONE 5 MG immediate release tablet Commonly known as:  Oxy IR/ROXICODONE Take 1 tablet (5 mg total) by mouth every 4 (four) hours as needed for severe pain.      Allergies  Allergen Reactions  . Aspirin Anaphylaxis and Swelling  . Ibuprofen Anaphylaxis and Swelling  . Latex Swelling    Facial   . Other Swelling    Muscle Relaxers Cause Swelling.    Follow-up Information    Maggie Font, MD. Schedule an appointment as soon as possible for a visit in 1 week(s).   Specialty:  Family Medicine Contact information: [address] [place] [address] [phone]            The results of significant diagnostics from this hospitalization (including imaging, microbiology, ancillary and laboratory) are listed below for  reference.    Significant Diagnostic Studies: No results found.  Microbiology: Recent Results (from the past 240 hour(s))  MRSA PCR Screening     Status: None   Collection Time: 10/24/16  2:44 AM  Result Value Ref Range Status   MRSA by PCR NEGATIVE NEGATIVE Final    Comment:        The GeneXpert MRSA Assay (FDA approved for NASAL specimens only), is one component of a comprehensive MRSA colonization surveillance program. It is not intended to diagnose MRSA infection nor to guide or monitor treatment for MRSA infections.      Labs: Basic Metabolic Panel:  Recent Labs Lab  10/24/16 0001 10/24/16 0441 10/26/16 0554  NA 133* 137 137  K 3.5 3.7 4.0  CL 101 106 106  CO2 27 26 26   GLUCOSE 118* 113* 132*  BUN 6 6 5*  CREATININE 0.52 0.48 0.46  CALCIUM 9.2 8.8* 8.8*   Liver Function Tests:  Recent Labs Lab 10/24/16 0001 10/24/16 0441  AST 12* 13*  ALT 12* 10*  ALKPHOS 58 50  BILITOT 0.2* 0.2*  PROT 7.9 6.8  ALBUMIN 3.8 3.3*    Recent Labs Lab 10/24/16 0001  LIPASE 70*   No results for input(s): AMMONIA in the last 168 hours. CBC:  Recent Labs Lab 10/24/16 0001 10/24/16 0441 10/26/16 0554  WBC 9.8 9.4 8.3  NEUTROABS 7.4  --   --   HGB 11.4* 10.5* 10.3*  HCT 34.2* 32.5* 31.3*  MCV 80.1 80.6 80.3  PLT 227 204 202   Cardiac Enzymes: No results for input(s): CKTOTAL, CKMB, CKMBINDEX, TROPONINI in the last 168 hours. BNP: BNP (last 3 results) No results for input(s): BNP in the last 8760 hours.  ProBNP (last 3 results) No results for input(s): PROBNP in the last 8760 hours.  CBG:  Recent Labs Lab 10/25/16 1102 10/25/16 1649 10/25/16 2149 10/26/16 0729 10/26/16 1105  GLUCAP 143* 121* 138* 144* 111*       Signed:  HERNANDEZ ACOSTA,Janthony Holleman  [facility] Pager: [phone] 10/26/2016, 12:54 PM   \

## 2016-10-26 NOTE — Progress Notes (Signed)
Discharge instructions reviewed with patient via teachback. Verbalized understanding. Given AVS and prescription. Pt verbalized understanding of f/u and when to call MD. Given note for work from MD as patient requested. Denies pain at this time. IV site d/c'd, site within normal limits. Pt left floor in stable condition via w/c accompanied by nurse tech. Discharged home. Donavan Foil, RN

## 2016-10-26 NOTE — Progress Notes (Signed)
Patient reported discharge today after seeing MD. Requested miralax this am if possible for constipation, received dose last night around midnight. Discussed with Dr. Jerilee Hoh, stated okay to give dose now. Okay to leave IV saline locked for possible discharge after lunch. Donavan Foil, RN

## 2016-10-31 ENCOUNTER — Other Ambulatory Visit: Payer: PRIVATE HEALTH INSURANCE

## 2017-07-07 ENCOUNTER — Other Ambulatory Visit: Payer: Self-pay

## 2017-07-07 ENCOUNTER — Observation Stay (HOSPITAL_COMMUNITY)
Admission: EM | Admit: 2017-07-07 | Discharge: 2017-07-10 | Disposition: A | Discharge status: Home / Self Care | Payer: BLUE CROSS/BLUE SHIELD | Attending: Family Medicine | Admitting: Family Medicine

## 2017-07-07 ENCOUNTER — Encounter (HOSPITAL_COMMUNITY): Payer: Self-pay | Admitting: *Deleted

## 2017-07-07 DIAGNOSIS — Z794 Long term (current) use of insulin: Secondary | ICD-10-CM | POA: Diagnosis not present

## 2017-07-07 DIAGNOSIS — R1013 Epigastric pain: Secondary | ICD-10-CM | POA: Diagnosis present

## 2017-07-07 DIAGNOSIS — K8500 Idiopathic acute pancreatitis without necrosis or infection: Principal | ICD-10-CM | POA: Insufficient documentation

## 2017-07-07 DIAGNOSIS — E119 Type 2 diabetes mellitus without complications: Secondary | ICD-10-CM | POA: Diagnosis not present

## 2017-07-07 DIAGNOSIS — Z91040 Latex allergy status: Secondary | ICD-10-CM | POA: Insufficient documentation

## 2017-07-07 DIAGNOSIS — K859 Acute pancreatitis without necrosis or infection, unspecified: Secondary | ICD-10-CM | POA: Diagnosis present

## 2017-07-07 DIAGNOSIS — Z79899 Other long term (current) drug therapy: Secondary | ICD-10-CM | POA: Diagnosis not present

## 2017-07-07 LAB — COMPREHENSIVE METABOLIC PANEL
ALT: 9 U/L — ABNORMAL LOW (ref 14–54)
ANION GAP: 6 (ref 5–15)
AST: 13 U/L — ABNORMAL LOW (ref 15–41)
Albumin: 3.7 g/dL (ref 3.5–5.0)
Alkaline Phosphatase: 61 U/L (ref 38–126)
BUN: 8 mg/dL (ref 6–20)
CHLORIDE: 106 mmol/L (ref 101–111)
CO2: 26 mmol/L (ref 22–32)
Calcium: 9.5 mg/dL (ref 8.9–10.3)
Creatinine, Ser: 0.5 mg/dL (ref 0.44–1.00)
Glucose, Bld: 126 mg/dL — ABNORMAL HIGH (ref 65–99)
POTASSIUM: 3.7 mmol/L (ref 3.5–5.1)
SODIUM: 138 mmol/L (ref 135–145)
Total Bilirubin: 0.3 mg/dL (ref 0.3–1.2)
Total Protein: 8 g/dL (ref 6.5–8.1)

## 2017-07-07 LAB — URINALYSIS, ROUTINE W REFLEX MICROSCOPIC
Bilirubin Urine: NEGATIVE
GLUCOSE, UA: NEGATIVE mg/dL
Hgb urine dipstick: NEGATIVE
KETONES UR: NEGATIVE mg/dL
LEUKOCYTES UA: NEGATIVE
NITRITE: NEGATIVE
PH: 6 (ref 5.0–8.0)
PROTEIN: NEGATIVE mg/dL
Specific Gravity, Urine: 1.011 (ref 1.005–1.030)

## 2017-07-07 LAB — CBC WITH DIFFERENTIAL/PLATELET
Basophils Absolute: 0 10*3/uL (ref 0.0–0.1)
Basophils Relative: 0 %
EOS ABS: 0 10*3/uL (ref 0.0–0.7)
Eosinophils Relative: 0 %
HEMATOCRIT: 33.1 % — AB (ref 36.0–46.0)
HEMOGLOBIN: 10.7 g/dL — AB (ref 12.0–15.0)
LYMPHS ABS: 1.7 10*3/uL (ref 0.7–4.0)
Lymphocytes Relative: 20 %
MCH: 26.2 pg (ref 26.0–34.0)
MCHC: 32.3 g/dL (ref 30.0–36.0)
MCV: 81.1 fL (ref 78.0–100.0)
MONOS PCT: 5 %
Monocytes Absolute: 0.5 10*3/uL (ref 0.1–1.0)
NEUTROS ABS: 6.4 10*3/uL (ref 1.7–7.7)
NEUTROS PCT: 75 %
Platelets: 274 10*3/uL (ref 150–400)
RBC: 4.08 MIL/uL (ref 3.87–5.11)
RDW: 14.2 % (ref 11.5–15.5)
WBC: 8.6 10*3/uL (ref 4.0–10.5)

## 2017-07-07 LAB — GLUCOSE, CAPILLARY
GLUCOSE-CAPILLARY: 111 mg/dL — AB (ref 65–99)
GLUCOSE-CAPILLARY: 104 mg/dL — AB (ref 65–99)
Glucose-Capillary: 117 mg/dL — ABNORMAL HIGH (ref 65–99)
Glucose-Capillary: 116 mg/dL — ABNORMAL HIGH (ref 65–99)

## 2017-07-07 LAB — POC URINE PREG, ED: PREG TEST UR: NEGATIVE

## 2017-07-07 LAB — LIPASE, BLOOD: LIPASE: 79 U/L — AB (ref 11–51)

## 2017-07-07 MED ORDER — ONDANSETRON HCL 4 MG/2ML IJ SOLN
4.0000 mg | Freq: Four times a day (QID) | INTRAMUSCULAR | Status: DC | PRN
Start: 2017-07-07 — End: 2017-07-10
  Administered 2017-07-07 – 2017-07-09 (×5): 4 mg via INTRAVENOUS
  Filled 2017-07-07 (×5): qty 2

## 2017-07-07 MED ORDER — INSULIN GLARGINE 100 UNIT/ML ~~LOC~~ SOLN
10.0000 [IU] | Freq: Every day | SUBCUTANEOUS | Status: DC
  Administered 2017-07-08 – 2017-07-09 (×2): 10 [IU] via SUBCUTANEOUS
  Filled 2017-07-07 (×4): qty 0.1

## 2017-07-07 MED ORDER — ONDANSETRON HCL 4 MG/2ML IJ SOLN
4.0000 mg | Freq: Once | INTRAMUSCULAR | Status: AC
  Administered 2017-07-07: 4 mg via INTRAVENOUS
  Filled 2017-07-07: qty 2

## 2017-07-07 MED ORDER — ACETAMINOPHEN 650 MG RE SUPP: 650.0000 mg | Freq: Four times a day (QID) | RECTAL | Status: DC | PRN

## 2017-07-07 MED ORDER — ENOXAPARIN SODIUM 40 MG/0.4ML ~~LOC~~ SOLN
40.0000 mg | Freq: Every day | SUBCUTANEOUS | Status: DC
  Administered 2017-07-07 – 2017-07-10 (×4): 40 mg via SUBCUTANEOUS
  Filled 2017-07-07 (×4): qty 0.4

## 2017-07-07 MED ORDER — HYDROMORPHONE HCL 1 MG/ML IJ SOLN
INTRAMUSCULAR | Status: AC
  Filled 2017-07-07: qty 1

## 2017-07-07 MED ORDER — SODIUM CHLORIDE 0.9 % IV SOLN
INTRAVENOUS | Status: DC
  Administered 2017-07-07 – 2017-07-10 (×6): via INTRAVENOUS

## 2017-07-07 MED ORDER — HYDROMORPHONE HCL 1 MG/ML IJ SOLN
1.0000 mg | Freq: Once | INTRAMUSCULAR | Status: AC
Start: 2017-07-07 — End: 2017-07-07
  Administered 2017-07-07: 1 mg via INTRAVENOUS
  Filled 2017-07-07: qty 1

## 2017-07-07 MED ORDER — ONDANSETRON HCL 4 MG PO TABS: 4.0000 mg | ORAL_TABLET | Freq: Four times a day (QID) | ORAL | Status: DC | PRN

## 2017-07-07 MED ORDER — ONDANSETRON HCL 4 MG/2ML IJ SOLN
4.0000 mg | Freq: Once | INTRAMUSCULAR | Status: AC
Start: 2017-07-07 — End: 2017-07-07
  Administered 2017-07-07: 4 mg via INTRAVENOUS
  Filled 2017-07-07: qty 2

## 2017-07-07 MED ORDER — SODIUM CHLORIDE 0.9 % IV BOLUS (SEPSIS)
1000.0000 mL | Freq: Once | INTRAVENOUS | Status: AC
  Administered 2017-07-07: 1000 mL via INTRAVENOUS

## 2017-07-07 MED ORDER — ACETAMINOPHEN 325 MG PO TABS: 650.0000 mg | ORAL_TABLET | Freq: Four times a day (QID) | ORAL | Status: DC | PRN

## 2017-07-07 MED ORDER — HYDROMORPHONE HCL 1 MG/ML IJ SOLN
1.0000 mg | INTRAMUSCULAR | Status: DC | PRN
  Administered 2017-07-07 – 2017-07-09 (×9): 1 mg via INTRAVENOUS
  Filled 2017-07-07 (×9): qty 1

## 2017-07-07 NOTE — ED Notes (Signed)
Pt taking sips of water at this time.

## 2017-07-07 NOTE — ED Triage Notes (Signed)
Pt c/o umbilical abdominal pain x 1 week with n/v; pt has a hx of chronic pancreatitis and states it is the same sx; pt states she has had to use ducolax suppositories x 2 this week to produce a BM

## 2017-07-07 NOTE — ED Provider Notes (Signed)
[facility] EMERGENCY DEPARTMENT Provider Note   CSN: [id number] Arrival date & time: 07/07/17  0221     History   Chief Complaint Chief Complaint  Patient presents with  . Abdominal Pain    HPI Tara Kane is a 46 y.o. female.  The history is provided by the patient.  Abdominal Pain   This is a new problem. The current episode started more than 1 week ago. The problem occurs daily. The problem has been gradually worsening. The pain is located in the epigastric region. The pain is severe. Associated symptoms include nausea and constipation. Pertinent negatives include fever, diarrhea and dysuria. The symptoms are aggravated by palpation. Nothing relieves the symptoms. Past medical history comments: Pancreatitis.   Patient with history of idiopathic pancreatitis, presents with epigastric abdominal pain for the past 1-2 weeks She reports nausea She is already been seen in urgent care for this pain, received pain medicines but her pain is not improving Reports some constipation but is able to pass bowel movements with Dulcolax  Past Medical History:  Diagnosis Date  . Anemia   . Diabetes mellitus without complication ([place])    15 yrs Lantus use.  Marland Kitchen Hx of seasonal allergies   . Pancreatitis, acute 09/29/2012, Nov 2016    Patient Active Problem List   Diagnosis Date Noted  . Acute pancreatitis 10/24/2016  . Chronic pancreatitis ([place]) 10/04/2015  . Ileus, unspecified ([place]) 06/04/2015  . Normocytic anemia 06/01/2015  . Type 2 diabetes mellitus without complication, with long-term current use of insulin ([place]) 06/01/2015  . Leukocytosis 05/31/2015  . Idiopathic acute pancreatitis without infection or necrosis   . Diabetes mellitus without complication ([place])   . Constipation 10/01/2012  . Seasonal allergies 09/30/2012  . Hyponatremia 09/30/2012  . Pancreatitis, acute 09/29/2012  . Obesity, unspecified 09/29/2012    Past Surgical History:  Procedure Laterality Date    . COLONOSCOPY N/A 08/14/2015   [id number] colon/single colonic polyp (tubular adenoma). Surveillance in 5 years  . ESOPHAGOGASTRODUODENOSCOPY N/A 08/14/2015   [id number]  . EUS  09/2015   Dr. Ardis Hughs: chronic pancreatitis  . EUS N/A 09/28/2015   Procedure: UPPER ENDOSCOPIC ULTRASOUND (EUS) LINEAR;  Surgeon: Milus Banister, MD;  Location: [facility] ENDOSCOPY;  Service: Endoscopy;  Laterality: N/A;  . EYE SURGERY      OB History    No data available       Home Medications    Prior to Admission medications   Medication Sig Start Date End Date Taking? Authorizing Provider  acetaminophen (TYLENOL) 500 MG tablet Take 500 mg by mouth every 6 (six) hours as needed for mild pain or moderate pain.    [provider]  fexofenadine (ALLEGRA) 180 MG tablet Take 180 mg by mouth daily.    [provider]  Insulin Glargine (LANTUS SOLOSTAR) 100 UNIT/ML Solostar Pen Inject 32 Units into the skin at bedtime.     [provider]  metFORMIN (GLUCOPHAGE-XR) 500 MG 24 hr tablet Take 1,000 mg by mouth 2 (two) times daily.  04/05/15   [provider]  Multiple Vitamin (MULTIVITAMIN) tablet Take 1 tablet by mouth daily. Reported on 07/19/2015    [provider]  oxyCODONE (OXY IR/ROXICODONE) 5 MG immediate release tablet Take 1 tablet (5 mg total) by mouth every 4 (four) hours as needed for severe pain. 10/26/16   Erline Hau, MD    Family History Family History  Problem Relation Age of Onset  . Diabetes Father   .  Polymyalgia rheumatica Mother   . Sarcoidosis Mother   . Colon cancer Neg Hx     Social History Social History   Tobacco Use  . Smoking status: Never Smoker  . Smokeless tobacco: Never Used  Substance Use Topics  . Alcohol use: No    Alcohol/week: 0.0 oz  . Drug use: No     Allergies   Aspirin; Ibuprofen; Latex; and Other   Review of Systems Review of Systems  Constitutional: Negative for fever.  Respiratory: Negative for  shortness of breath.   Cardiovascular: Negative for chest pain.  Gastrointestinal: Positive for abdominal pain, constipation and nausea. Negative for blood in stool and diarrhea.  Genitourinary: Negative for dysuria.  All other systems reviewed and are negative.    Physical Exam Updated Vital Signs BP (!) 143/83 (BP Location: Right Arm)   Pulse 87   Temp 97.9 F (36.6 C) (Oral)   Resp 18   Ht 1.778 m (5\' 10" )   Wt 90.7 kg (200 lb)   LMP 05/22/2017   SpO2 99%   BMI 28.70 kg/m   Physical Exam CONSTITUTIONAL: Well developed/well nourished, uncomfortable appearing HEAD: Normocephalic/atraumatic EYES: EOMI/PERRL, no icterus ENMT: Mucous membranes moist NECK: supple no meningeal signs SPINE/BACK: Mild lumbar tenderness, No bruising/crepitance/stepoffs noted to spine CV: S1/S2 noted, no murmurs/rubs/gallops noted LUNGS: Lungs are clear to auscultation bilaterally, no apparent distress ABDOMEN: soft, moderate epigastric tenderness, no rebound or guarding, bowel sounds noted throughout abdomen GU:no cva tenderness NEURO: Pt is awake/alert/appropriate, moves all extremitiesx4.  No facial droop.   EXTREMITIES: pulses normal/equal, full ROM SKIN: warm, color normal PSYCH: no abnormalities of mood noted, alert and oriented to situation   ED Treatments / Results  Labs (all labs ordered are listed, but only abnormal results are displayed) Labs Reviewed  COMPREHENSIVE METABOLIC PANEL - Abnormal; Notable for the following components:      Result Value   Glucose, Bld 126 (*)    AST 13 (*)    ALT 9 (*)    All other components within normal limits  CBC WITH DIFFERENTIAL/PLATELET - Abnormal; Notable for the following components:   Hemoglobin 10.7 (*)    HCT 33.1 (*)    All other components within normal limits  LIPASE, BLOOD - Abnormal; Notable for the following components:   Lipase 79 (*)    All other components within normal limits  URINALYSIS, ROUTINE W REFLEX MICROSCOPIC  POC  URINE PREG, ED    EKG  EKG Interpretation None       Radiology No results found.  Procedures Procedures   Medications Ordered in ED Medications  HYDROmorphone (DILAUDID) injection 1 mg (1 mg Intravenous Given 07/07/17 0312)  ondansetron (ZOFRAN) injection 4 mg (4 mg Intravenous Given 07/07/17 0312)  sodium chloride 0.9 % bolus 1,000 mL (1,000 mLs Intravenous New Bag/Given 07/07/17 0309)  HYDROmorphone (DILAUDID) injection 1 mg (1 mg Intravenous Given 07/07/17 0538)  ondansetron (ZOFRAN) injection 4 mg (4 mg Intravenous Given 07/07/17 0538)     Initial Impression / Assessment and Plan / ED Course  I have reviewed the triage vital signs and the nursing notes.  Pertinent labs results that were available during my care of the patient were reviewed by me and considered in my medical decision making (see chart for details). Narcotic database reviewed and considered in decision making    3:43 AM Patient with history of idiopathic pancreatitis, she denies history of alcohol abuse. She reports recurrent abdominal pain similar to previous episodes She is feeling  better after her first round of pain medications, labs are pending 5:39 AM Pt is starting have return of abd pain She reports she has been dealing with this pain >7 days, and has been using home pain meds without any relief When she began drinking fluids here in the ED, her pain returned Due to persistence of pain for over 7 days, failure to manage pain in the ER and known h/o recurrent pancreatitis, she meets criteria for admission She has been admitted for up to 3 days previously for similar type episode Will defer CT imaging as this is a known/recurrent issue 5:48 AM D/w dr Darrick Meigs for admission  Final Clinical Impressions(s) / ED Diagnoses   Final diagnoses:  Idiopathic acute pancreatitis without infection or necrosis    ED Discharge Orders    None       Ripley Fraise, MD 07/07/17 [phone]

## 2017-07-07 NOTE — ED Notes (Signed)
Pt reports small amount of abdominal pain after drinking water

## 2017-07-07 NOTE — ED Notes (Signed)
Per Adonis Brook, RN 3rd floor, the nurse who is getting this pt is Debroah Baller and that she will call me when she gets out of her patient's room.

## 2017-07-07 NOTE — Progress Notes (Signed)
Patient seen. Agree with Dr. Toney Sang history and physical.  Acute pancreatitis in setting of chronic pancreatitis.  Will keep NPO, continue IVF and pain control.  If pain not well controlled or worsened tomorrow consider CT and formal GI consult.

## 2017-07-07 NOTE — H&P (Signed)
TR[facility] H&P    Patient Demographics:    Tara Kane, is a 46 y.o. female  MRN: [id number]  DOB - Jul 27, 1970  Admit Date - 07/07/2017  Referring MD/NP/PA: Dr Christy Gentles  Outpatient Primary MD for the patient is Iona Beard, MD  Patient coming from: home  Chief Complaint  Patient presents with  . Abdominal Pain      HPI:    Tara Kane  is a 46 y.o. female, with history of chronic pancreatitis came to hospital with abdominal pain for the past one week. Patient says that she was seen by urgent care and was prescribed Vicodin PRN. She has been taking this pain medication for past one week but with not much relief. She also had 2 to 4 episodes of vomiting and nausea. She denies diarrhea. Patient was diagnosed with idiopathic pancreatitis, and had multiple episodes in the past. She complains of constipation. Denies chest pain or shortness of breath.  In the ED, lab work showed lipase 79.    Review of systems:      All other systems reviewed and are negative.   With Past History of the following :    Past Medical History:  Diagnosis Date  . Anemia   . Diabetes mellitus without complication ([place])    15 yrs Lantus use.  Marland Kitchen Hx of seasonal allergies   . Pancreatitis, acute 09/29/2012, Nov 2016      Past Surgical History:  Procedure Laterality Date  . COLONOSCOPY N/A 08/14/2015   [id number] colon/single colonic polyp (tubular adenoma). Surveillance in 5 years  . ESOPHAGOGASTRODUODENOSCOPY N/A 08/14/2015   [id number]  . EUS  09/2015   Dr. Ardis Hughs: chronic pancreatitis  . EUS N/A 09/28/2015   Procedure: UPPER ENDOSCOPIC ULTRASOUND (EUS) LINEAR;  Surgeon: Milus Banister, MD;  Location: [facility];  Service: Endoscopy;  Laterality: N/A;  . EYE SURGERY        Social History:      Social History   Tobacco Use  . Smoking status: Never Smoker  . Smokeless tobacco: Never Used  Substance  Use Topics  . Alcohol use: No    Alcohol/week: 0.0 oz       Family History :     Family History  Problem Relation Age of Onset  . Diabetes Father   . Polymyalgia rheumatica Mother   . Sarcoidosis Mother   . Colon cancer Neg Hx       Home Medications:   Prior to Admission medications   Medication Sig Start Date End Date Taking? Authorizing Provider  acetaminophen (TYLENOL) 500 MG tablet Take 500 mg by mouth every 6 (six) hours as needed for mild pain or moderate pain.    [provider]  fexofenadine (ALLEGRA) 180 MG tablet Take 180 mg by mouth daily.    [provider]  Insulin Glargine (LANTUS SOLOSTAR) 100 UNIT/ML Solostar Pen Inject 32 Units into the skin at bedtime.     [provider]  metFORMIN (GLUCOPHAGE-XR) 500 MG 24 hr tablet Take 1,000 mg by mouth 2 (two) times daily.  04/05/15   [provider]  Multiple Vitamin (MULTIVITAMIN) tablet Take 1 tablet by mouth daily. Reported on 07/19/2015    [provider]  oxyCODONE (OXY IR/ROXICODONE) 5 MG immediate release tablet Take 1 tablet (5 mg total) by mouth every 4 (four) hours as needed for severe pain. 10/26/16   Isaac Bliss, Rayford Halsted, MD     Allergies:     Allergies  Allergen Reactions  . Aspirin Anaphylaxis and Swelling  . Ibuprofen Anaphylaxis and Swelling  . Latex Swelling    Facial   . Other Swelling    Muscle Relaxers Cause Swelling.      Physical Exam:   Vitals  Blood pressure 125/74, pulse 64, temperature 97.9 F (36.6 C), temperature source Oral, resp. rate 17, height 5\' 10"  (1.778 m), weight 90.7 kg (200 lb), last menstrual period 05/22/2017, SpO2 93 %.  1.  General: appears in no acute distress  2. Psychiatric:  Intact judgement and  insight, awake alert, oriented x 3.  3. Neurologic: No focal neurological deficits, all cranial nerves intact.Strength 5/5 all 4 extremities, sensation intact all 4 extremities, plantars down going.  4. Eyes :   anicteric sclerae, moist conjunctivae with no lid lag. PERRLA.  5. ENMT:  Oropharynx clear with moist mucous membranes and good dentition  6. Neck:  supple, no cervical lymphadenopathy appriciated, No thyromegaly  7. Respiratory : Normal respiratory effort, good air movement bilaterally,clear to  auscultation bilaterally  8. Cardiovascular : RRR, no gallops, rubs or murmurs, no leg edema  9. Gastrointestinal:  Positive bowel sounds, abdomen soft, tender to palpation in epigastric region,no hepatosplenomegaly, no rigidity or guarding       10. Skin:  No cyanosis, normal texture and turgor, no rash, lesions or ulcers  11.Musculoskeletal:  Good muscle tone,  joints appear normal , no effusions,  normal range of motion    Data Review:    CBC Recent Labs  Lab 07/07/17 0310  WBC 8.6  HGB 10.7*  HCT 33.1*  PLT 274  MCV 81.1  MCH 26.2  MCHC 32.3  RDW 14.2  LYMPHSABS 1.7  MONOABS 0.5  EOSABS 0.0  BASOSABS 0.0   ------------------------------------------------------------------------------------------------------------------  Chemistries  Recent Labs  Lab 07/07/17 0310  NA 138  K 3.7  CL 106  CO2 26  GLUCOSE 126*  BUN 8  CREATININE 0.50  CALCIUM 9.5  AST 13*  ALT 9*  ALKPHOS 61  BILITOT 0.3   ------------------------------------------------------------------------------------------------------------------  ------------------------------------------------------------------------------------------------------------------ GFR: Estimated Creatinine Clearance: 106.2 mL/min (by C-G formula based on SCr of 0.5 mg/dL). Liver Function Tests: Recent Labs  Lab 07/07/17 0310  AST 13*  ALT 9*  ALKPHOS 61  BILITOT 0.3  PROT 8.0  ALBUMIN 3.7   Recent Labs  Lab 07/07/17 0310  LIPASE 79*     --------------------------------------------------------------------------------------------------------------- Urine analysis:    Component Value Date/Time    COLORURINE YELLOW 07/07/2017 0239   APPEARANCEUR CLEAR 07/07/2017 0239   LABSPEC 1.011 07/07/2017 0239   PHURINE 6.0 07/07/2017 0239   GLUCOSEU NEGATIVE 07/07/2017 0239   HGBUR NEGATIVE 07/07/2017 0239   BILIRUBINUR NEGATIVE 07/07/2017 0239   KETONESUR NEGATIVE 07/07/2017 0239   PROTEINUR NEGATIVE 07/07/2017 0239   UROBILINOGEN 0.2 05/29/2015 2000   NITRITE NEGATIVE 07/07/2017 0239   LEUKOCYTESUR NEGATIVE 07/07/2017 0239      Imaging Results:    No results found.  My personal review of EKG: Rhythm NSR   Assessment & Plan:    Active Problems:   Pancreatitis   1.  Acute pancreatitis-presenting as exacerbation of chronic pancreatitis, will keep on NPO. Continue IV normal saline at 125 per hour. Started out at 1 mg IV Q4 hours PRN. Zofran PRN for nausea and vomiting. 2. Diabetes mellitus-patient takes Lantus 32 units at bedtime at home. Will change Lantus dose to 10 units sub Q daily at bedtime. Sliding scale insulin with no long.    DVT Prophylaxis-   Lovenox   AM Labs Ordered, also please review Full Orders  Family Communication: Admission, patients condition and plan of care including tests being ordered have been discussed with the patient  who indicate understanding and agree with the plan and Code Status.  Code Status:  full code  Admission status:  observation    Time spent in minutes :60 minutes   Oswald Hillock M.D on 07/07/2017 at 6:23 AM  Between 7am to 7pm - Pager - [phone]. After 7pm go to [email or web address] - password [email or web address]  [facility] - Office  [phone]

## 2017-07-08 DIAGNOSIS — K861 Other chronic pancreatitis: Secondary | ICD-10-CM | POA: Diagnosis not present

## 2017-07-08 DIAGNOSIS — K8500 Idiopathic acute pancreatitis without necrosis or infection: Secondary | ICD-10-CM | POA: Diagnosis not present

## 2017-07-08 LAB — COMPREHENSIVE METABOLIC PANEL
ALK PHOS: 57 U/L (ref 38–126)
ALT: 8 U/L — ABNORMAL LOW (ref 14–54)
AST: 9 U/L — ABNORMAL LOW (ref 15–41)
Albumin: 3.5 g/dL (ref 3.5–5.0)
Anion gap: 10 (ref 5–15)
BUN: 6 mg/dL (ref 6–20)
CALCIUM: 9.2 mg/dL (ref 8.9–10.3)
CO2: 24 mmol/L (ref 22–32)
CREATININE: 0.48 mg/dL (ref 0.44–1.00)
Chloride: 102 mmol/L (ref 101–111)
GFR calc Af Amer: >60 mL/min (ref >60)
GFR calc non Af Amer: >60 mL/min (ref >60)
GLUCOSE: 116 mg/dL — AB (ref 65–99)
Potassium: 3.9 mmol/L (ref 3.5–5.1)
SODIUM: 136 mmol/L (ref 135–145)
Total Bilirubin: 0.3 mg/dL (ref 0.3–1.2)
Total Protein: 7.5 g/dL (ref 6.5–8.1)

## 2017-07-08 LAB — GLUCOSE, CAPILLARY
GLUCOSE-CAPILLARY: 143 mg/dL — AB (ref 65–99)
Glucose-Capillary: 94 mg/dL (ref 65–99)
Glucose-Capillary: 129 mg/dL — ABNORMAL HIGH (ref 65–99)

## 2017-07-08 LAB — CBC
HCT: 34.2 % — ABNORMAL LOW (ref 36.0–46.0)
Hemoglobin: 10.8 g/dL — ABNORMAL LOW (ref 12.0–15.0)
MCH: 26 pg (ref 26.0–34.0)
MCHC: 31.6 g/dL (ref 30.0–36.0)
MCV: 82.4 fL (ref 78.0–100.0)
PLATELETS: 300 10*3/uL (ref 150–400)
RBC: 4.15 MIL/uL (ref 3.87–5.11)
RDW: 14.2 % (ref 11.5–15.5)
WBC: 9.6 10*3/uL (ref 4.0–10.5)

## 2017-07-08 NOTE — Progress Notes (Signed)
PROGRESS NOTE    Tara Kane  GYI:[id number] DOB: [birth year] DOA: 07/07/2017 PCP: Iona Beard, MD   Brief Narrative:  Tara Kane  is a 46 y.o. female, with history of chronic pancreatitis came to hospital with abdominal pain for the past one week. Patient says that she was seen by urgent care and was prescribed Vicodin PRN. She has been taking this pain medication for past one week but with not much relief. She also had 2 to 4 episodes of vomiting and nausea. She denies diarrhea. Patient was diagnosed with idiopathic pancreatitis, and had multiple episodes in the past. She complains of constipation. Denies chest pain or shortness of breath.  In the ED, lab work showed lipase 79.     Assessment & Plan:   Active Problems:   Pancreatitis  Acute pancreatitis -presenting as exacerbation of chronic pancreatitis -Can start clear liquids today -Continue IV fluids -Patient reports good pain control on current regimen -Continue Zofran as needed for nausea and vomiting  Diabetes mellitus -Continue with Lantus 10 units subcu daily -Continue SSI      DVT prophylaxis: Lovenox Code Status: Full code Family Communication: Mother is bedside Disposition Plan: Pending improvement in pain and tolerance of diet   Consultants:   None  Procedures:   None  Antimicrobials:   None   Subjective: Patient seen this morning.  She voices that her pain has been well controlled with current pain medication.  She voices that she is tolerated a few ice chips with no minimal pain.  She is asking for clear liquid diet.  Objective: Vitals:   07/07/17 1400 07/07/17 2015 07/07/17 2100 07/08/17 0552  BP: 118/65  122/65 135/66  Pulse: 72  66 66  Resp: 16  16 16   Temp: 98.5 F (36.9 C)  98.1 F (36.7 C) 98 F (36.7 C)  TempSrc: Oral  Oral Oral  SpO2: 99% 96% 96% 95%  Weight:      Height:        Intake/Output Summary (Last 24 hours) at 07/08/2017 1049 Last data filed  at 07/08/2017 0900 Gross per 24 hour  Intake 2375.42 ml  Output 1300 ml  Net 1075.42 ml   Filed Weights   07/07/17 0234 07/07/17 0924  Weight: 90.7 kg (200 lb) 94.9 kg (209 lb 3.5 oz)    Examination:  General exam: Appears calm and comfortable  Respiratory system: Clear to auscultation. Respiratory effort normal. Cardiovascular system: S1 & S2 heard, RRR. No JVD, murmurs, rubs, gallops or clicks. No pedal edema. Gastrointestinal system: Abdomen is nondistended, soft and minimally tender in the epigastrium.  No organomegaly or masses felt. Normal bowel sounds heard. Central nervous system: Alert and oriented. No focal neurological deficits. Extremities: Symmetric 5 x 5 power. Skin: No rashes, lesions or ulcers Psychiatry: Judgement and insight appear normal. Mood & affect appropriate.     Data Reviewed: I have personally reviewed following labs and imaging studies  CBC: Recent Labs  Lab 07/07/17 0310 07/08/17 0605  WBC 8.6 9.6  NEUTROABS 6.4  --   HGB 10.7* 10.8*  HCT 33.1* 34.2*  MCV 81.1 82.4  PLT 274 350   Basic Metabolic Panel: Recent Labs  Lab 07/07/17 0310 07/08/17 0605  NA 138 136  K 3.7 3.9  CL 106 102  CO2 26 24  GLUCOSE 126* 116*  BUN 8 6  CREATININE 0.50 0.48  CALCIUM 9.5 9.2   GFR: Estimated Creatinine Clearance: 108.6 mL/min (by C-G formula based on SCr of 0.48 mg/dL).  Liver Function Tests: Recent Labs  Lab 07/07/17 0310 07/08/17 0605  AST 13* 9*  ALT 9* 8*  ALKPHOS 61 57  BILITOT 0.3 0.3  PROT 8.0 7.5  ALBUMIN 3.7 3.5   Recent Labs  Lab 07/07/17 0310  LIPASE 79*   No results for input(s): AMMONIA in the last 168 hours. Coagulation Profile: No results for input(s): INR, PROTIME in the last 168 hours. Cardiac Enzymes: No results for input(s): CKTOTAL, CKMB, CKMBINDEX, TROPONINI in the last 168 hours. BNP (last 3 results) No results for input(s): PROBNP in the last 8760 hours. HbA1C: No results for input(s): HGBA1C in the last  72 hours. CBG: Recent Labs  Lab 07/07/17 1132 07/07/17 1615 07/07/17 1809 07/07/17 2145 07/08/17 0736  GLUCAP 117* 111* 116* 104* 94   Lipid Profile: No results for input(s): CHOL, HDL, LDLCALC, TRIG, CHOLHDL, LDLDIRECT in the last 72 hours. Thyroid Function Tests: No results for input(s): TSH, T4TOTAL, FREET4, T3FREE, THYROIDAB in the last 72 hours. Anemia Panel: No results for input(s): VITAMINB12, FOLATE, FERRITIN, TIBC, IRON, RETICCTPCT in the last 72 hours. Sepsis Labs: No results for input(s): PROCALCITON, LATICACIDVEN in the last 168 hours.  No results found for this or any previous visit (from the past 240 hour(s)).       Radiology Studies: No results found.      Scheduled Meds: . enoxaparin (LOVENOX) injection  40 mg Subcutaneous Daily  . insulin glargine  10 Units Subcutaneous QHS   Continuous Infusions: . sodium chloride 125 mL/hr at 07/08/17 0214     LOS: 0 days    Time spent: 25 minutes    Loretha Stapler, MD [facility] Pager [phone]  If 7PM-7AM, please contact night-coverage [email or web address] Password [email or web address] 07/08/2017, 10:49 AM

## 2017-07-09 DIAGNOSIS — K8500 Idiopathic acute pancreatitis without necrosis or infection: Secondary | ICD-10-CM | POA: Diagnosis not present

## 2017-07-09 LAB — GLUCOSE, CAPILLARY
GLUCOSE-CAPILLARY: 109 mg/dL — AB (ref 65–99)
GLUCOSE-CAPILLARY: 92 mg/dL (ref 65–99)
Glucose-Capillary: 99 mg/dL (ref 65–99)
Glucose-Capillary: 129 mg/dL — ABNORMAL HIGH (ref 65–99)

## 2017-07-09 MED ORDER — MORPHINE SULFATE (PF) 2 MG/ML IV SOLN
2.0000 mg | INTRAVENOUS | Status: DC | PRN
  Administered 2017-07-09: 2 mg via INTRAVENOUS
  Filled 2017-07-09: qty 1

## 2017-07-09 NOTE — Progress Notes (Signed)
PROGRESS NOTE    Tara Kane  JXB:[id number] DOB: 1971-03-21 DOA: 07/07/2017 PCP: Iona Beard, MD   Brief Narrative:  Tara Kane  is a 46 y.o. female, with history of chronic pancreatitis came to hospital with abdominal pain for the past one week. Patient says that she was seen by urgent care and was prescribed Vicodin PRN. She has been taking this pain medication for past one week but with not much relief. She also had 2 to 4 episodes of vomiting and nausea. She denies diarrhea. Patient was diagnosed with idiopathic pancreatitis, and had multiple episodes in the past. She complains of constipation. Denies chest pain or shortness of breath.  In the ED, lab work showed lipase 79.  Patient was admitted to the hospital and started on aggressive IV fluid resuscitation.  Her pain was well controlled with IV Dilaudid.  On 07/08/2017 pain was controlled well enough that patient was started on a clear liquid diet.  Patient voiced tolerance of clear liquid diet and was transitioned to a soft diet on 07/09/2017.     Assessment & Plan:   Active Problems:   Pancreatitis  Acute pancreatitis -presenting as exacerbation of chronic pancreatitis -Advance diet to soft diet today -Discontinue IV fluids as patient is starting to tolerate p.o. -Patient reports good pain control on current regimen but will decrease intensity of pain medication to morphine in anticipation for discharge -Continue Zofran as needed for nausea and vomiting -If patient tolerates soft diet can consider discharge in a.m.  Diabetes mellitus -Continue with Lantus 10 units subcu daily -Continue SSI      DVT prophylaxis: Lovenox Code Status: Full code Family Communication: No family is bedside Disposition Plan: If tolerates soft diet can consider discharge in the a.m.   Consultants:   None  Procedures:   None  Antimicrobials:   None   Subjective: Patient states she slept well.  She says her  pain is well controlled with IV Dilaudid and that she was able to tolerate a clear liquid diet yesterday.  She is asking for advancing her diet to soft today.  Objective: Vitals:   07/08/17 0552 07/08/17 1814 07/08/17 2100 07/09/17 0500  BP: 135/66 125/62 134/69 122/71  Pulse: 66 65 67 71  Resp: 16 18 18 18   Temp: 98 F (36.7 C) 97.6 F (36.4 C) 97.7 F (36.5 C) 97.8 F (36.6 C)  TempSrc: Oral  Oral Oral  SpO2: 95% 97% 100% 98%  Weight:      Height:        Intake/Output Summary (Last 24 hours) at 07/09/2017 1425 Last data filed at 07/09/2017 1400 Gross per 24 hour  Intake 4883.75 ml  Output 1600 ml  Net 3283.75 ml   Filed Weights   07/07/17 0234 07/07/17 0924  Weight: 90.7 kg (200 lb) 94.9 kg (209 lb 3.5 oz)    Examination:  General exam: Appears calm and comfortable  Respiratory system: Clear to auscultation. Respiratory effort normal. Cardiovascular system: S1 & S2 heard, RRR. No JVD, murmurs, rubs, gallops or clicks. No pedal edema. Gastrointestinal system: Abdomen is nondistended, soft and tenderness improved and only can elicit pain on deep palpation in the epigastrium no organomegaly or masses felt. Normal bowel sounds heard. Central nervous system: Alert and oriented. No focal neurological deficits. Extremities: Symmetric 5 x 5 power. Skin: No rashes, lesions or ulcers Psychiatry: Judgement and insight appear normal. Mood & affect appropriate.     Data Reviewed: I have personally reviewed following labs and imaging  studies  CBC: Recent Labs  Lab 07/07/17 0310 07/08/17 0605  WBC 8.6 9.6  NEUTROABS 6.4  --   HGB 10.7* 10.8*  HCT 33.1* 34.2*  MCV 81.1 82.4  PLT 274 702   Basic Metabolic Panel: Recent Labs  Lab 07/07/17 0310 07/08/17 0605  NA 138 136  K 3.7 3.9  CL 106 102  CO2 26 24  GLUCOSE 126* 116*  BUN 8 6  CREATININE 0.50 0.48  CALCIUM 9.5 9.2   GFR: Estimated Creatinine Clearance: 108.6 mL/min (by C-G formula based on SCr of 0.48  mg/dL). Liver Function Tests: Recent Labs  Lab 07/07/17 0310 07/08/17 0605  AST 13* 9*  ALT 9* 8*  ALKPHOS 61 57  BILITOT 0.3 0.3  PROT 8.0 7.5  ALBUMIN 3.7 3.5   Recent Labs  Lab 07/07/17 0310  LIPASE 79*   No results for input(s): AMMONIA in the last 168 hours. Coagulation Profile: No results for input(s): INR, PROTIME in the last 168 hours. Cardiac Enzymes: No results for input(s): CKTOTAL, CKMB, CKMBINDEX, TROPONINI in the last 168 hours. BNP (last 3 results) No results for input(s): PROBNP in the last 8760 hours. HbA1C: No results for input(s): HGBA1C in the last 72 hours. CBG: Recent Labs  Lab 07/08/17 0736 07/08/17 1817 07/08/17 2134 07/09/17 0738 07/09/17 1137  GLUCAP 94 143* 129* 109* 92   Lipid Profile: No results for input(s): CHOL, HDL, LDLCALC, TRIG, CHOLHDL, LDLDIRECT in the last 72 hours. Thyroid Function Tests: No results for input(s): TSH, T4TOTAL, FREET4, T3FREE, THYROIDAB in the last 72 hours. Anemia Panel: No results for input(s): VITAMINB12, FOLATE, FERRITIN, TIBC, IRON, RETICCTPCT in the last 72 hours. Sepsis Labs: No results for input(s): PROCALCITON, LATICACIDVEN in the last 168 hours.  No results found for this or any previous visit (from the past 240 hour(s)).       Radiology Studies: No results found.      Scheduled Meds: . enoxaparin (LOVENOX) injection  40 mg Subcutaneous Daily  . insulin glargine  10 Units Subcutaneous QHS   Continuous Infusions: . sodium chloride 125 mL/hr at 07/09/17 1346     LOS: 0 days    Time spent: 25 minutes    Loretha Stapler, MD [facility] Pager [phone]  If 7PM-7AM, please contact night-coverage [email or web address] Password [email or web address] 07/09/2017, 2:25 PM

## 2017-07-09 NOTE — Discharge Summary (Addendum)
Physician Discharge Summary  Tara Kane NLG:[id number] DOB: 14-Dec-1970 DOA: 07/07/2017  PCP: Tara Beard, MD  Admit date: 07/07/2017 Discharge date: 07/10/2017  Admitted From: Home Disposition: Home  Recommendations for Outpatient Follow-up:  1. Follow up with PCP in 1-2 weeks 2. You are on a waiting list to be called if an appointment at [facility] opens earlier than your previously scheduled appointment, 3. Please obtain BMP/CBC in one week 4. Follow-up with your primary care physician within the next 7-10 days  Home Health: None Equipment/Devices: None  Discharge Condition: Stable CODE STATUS: Full code Diet recommendation: Regular diet  Brief/Interim Summary: [name] Y80 y.o.female,with history of chronic pancreatitis came to hospital with abdominal pain for the past one week. Patient says that she was seen by urgent care and was prescribed Vicodin PRN. She has been taking this pain medication for past one week but with not much relief. She also had 2 to 4 episodes of vomiting and nausea. She denies diarrhea. Patient was diagnosed with idiopathic pancreatitis,and had multiple episodes in the past. She complains of constipation. Denies chest pain or shortness of breath.  In the ED, lab work showed lipase 79.  Patient was admitted to the hospital and started on aggressive IV fluid resuscitation.  Her pain was well controlled with IV Dilaudid.  On 07/08/2017 pain was controlled well enough that patient was started on a clear liquid diet.  Patient voiced tolerance of clear liquid diet and was transitioned to a soft diet on 07/09/2017.  Pain was well controlled on IV morphine and patient tolerated a soft diet well.  She was stable for discharge on 07/10/2017 and she was put on a waiting list to be followed up at  [facility] gastro[facility]ology prior to her appointment in February.  Patient asking for a prescription for pain medication at time of  discharge.  Upon review of controlled substance database patient received 1 prescription for oxycodone on June 28, 2017 but prior to that had not had any other prescriptions for 2 years.  I did also search for [place] and the above prescription was found from the [place] ED. Prescription for 10 pills of oxycodone given at time of discharge.    Discharge Diagnoses:  Active Problems:   Pancreatitis    Discharge Instructions  Discharge Instructions    Call MD for:  difficulty breathing, headache or visual disturbances   Complete by:  As directed    Call MD for:  extreme fatigue   Complete by:  As directed    Call MD for:  hives   Complete by:  As directed    Call MD for:  persistant dizziness or light-headedness   Complete by:  As directed    Call MD for:  persistant nausea and vomiting   Complete by:  As directed    Call MD for:  severe uncontrolled pain   Complete by:  As directed    Call MD for:  temperature >100.4   Complete by:  As directed    Diet general   Complete by:  As directed    Increase activity slowly   Complete by:  As directed      Allergies as of 07/10/2017      Reactions   Aspirin Anaphylaxis, Swelling   Ibuprofen Anaphylaxis, Swelling   Latex Swelling   Facial    Other Swelling   Muscle Relaxers Cause Swelling.       Medication List    TAKE these medications   acetaminophen  500 MG tablet Commonly known as:  TYLENOL Take 500 mg by mouth every 6 (six) hours as needed for mild pain or moderate pain.   fexofenadine 180 MG tablet Commonly known as:  ALLEGRA Take 180 mg by mouth daily.   LANTUS SOLOSTAR 100 UNIT/ML Solostar Pen Generic drug:  Insulin Glargine Inject 10 Units into the skin at bedtime.   metFORMIN 500 MG 24 hr tablet Commonly known as:  GLUCOPHAGE-XR Take 1,000 mg by mouth 2 (two) times daily.   multivitamin tablet Take 1 tablet by mouth daily. Reported on 07/19/2015   oxyCODONE 5 MG immediate release tablet Commonly  known as:  Oxy IR/ROXICODONE Take 1 tablet (5 mg total) by mouth every 4 (four) hours as needed for severe pain.      Follow-up Information    Tara Needs, NP. Go to.   Specialty:  Gastroenterology Contact information: [place] [address] [phone]        Tara Beard, MD. Schedule an appointment as soon as possible for a visit in 1 week(s).   Specialty:  Family Medicine Contact information: [address] [place] [address] [phone]          Allergies  Allergen Reactions  . Aspirin Anaphylaxis and Swelling  . Ibuprofen Anaphylaxis and Swelling  . Latex Swelling    Facial   . Other Swelling    Muscle Relaxers Cause Swelling.     Consultations:  None   Procedures/Studies: No results found.     Subjective: Patient seen and evaluated.  She states her pain is well controlled and she feels markedly better.  She says she would like to discharge today.  She called [facility] yesterday to make a follow-up appointment but unfortunately was only able to make an appointment in February 2019.  Discharge Exam: Vitals:   07/10/17 0548 07/10/17 0726  BP: 126/68   Pulse: 67   Resp: 20   Temp: 98.4 F (36.9 C)   SpO2: 98% 97%   Vitals:   07/09/17 2038 07/09/17 2126 07/10/17 0548 07/10/17 0726  BP:  134/74 126/68   Pulse:  64 67   Resp:  20 20   Temp:  (!) 97.5 F (36.4 C) 98.4 F (36.9 C)   TempSrc:  Oral Oral   SpO2: 99% 100% 98% 97%  Weight:      Height:        General: Pt is alert, awake, not in acute distress Cardiovascular: RRR, S1/S2 +, no rubs, no gallops Respiratory: CTA bilaterally, no wheezing, no rhonchi Abdominal: Soft, NT, ND, bowel sounds + Extremities: no edema, no cyanosis    The results of significant diagnostics from this hospitalization (including imaging, microbiology, ancillary and laboratory) are listed below for reference.     Microbiology: No results found for this or any  previous visit (from the past 240 hour(s)).   Labs: BNP (last 3 results) No results for input(s): BNP in the last 8760 hours. Basic Metabolic Panel: Recent Labs  Lab 07/07/17 0310 07/08/17 0605  NA 138 136  K 3.7 3.9  CL 106 102  CO2 26 24  GLUCOSE 126* 116*  BUN 8 6  CREATININE 0.50 0.48  CALCIUM 9.5 9.2   Liver Function Tests: Recent Labs  Lab 07/07/17 0310 07/08/17 0605  AST 13* 9*  ALT 9* 8*  ALKPHOS 61 57  BILITOT 0.3 0.3  PROT 8.0 7.5  ALBUMIN 3.7 3.5   Recent Labs  Lab 07/07/17  0310  LIPASE 79*   No results for input(s): AMMONIA in the last 168 hours. CBC: Recent Labs  Lab 07/07/17 0310 07/08/17 0605  WBC 8.6 9.6  NEUTROABS 6.4  --   HGB 10.7* 10.8*  HCT 33.1* 34.2*  MCV 81.1 82.4  PLT 274 300   Cardiac Enzymes: No results for input(s): CKTOTAL, CKMB, CKMBINDEX, TROPONINI in the last 168 hours. BNP: Invalid input(s): POCBNP CBG: Recent Labs  Lab 07/09/17 0738 07/09/17 1137 07/09/17 1655 07/09/17 2125 07/10/17 0720  GLUCAP 109* 92 99 129* 74   D-Dimer No results for input(s): DDIMER in the last 72 hours. Hgb A1c No results for input(s): HGBA1C in the last 72 hours. Lipid Profile No results for input(s): CHOL, HDL, LDLCALC, TRIG, CHOLHDL, LDLDIRECT in the last 72 hours. Thyroid function studies No results for input(s): TSH, T4TOTAL, T3FREE, THYROIDAB in the last 72 hours.  Invalid input(s): FREET3 Anemia work up No results for input(s): VITAMINB12, FOLATE, FERRITIN, TIBC, IRON, RETICCTPCT in the last 72 hours. Urinalysis    Component Value Date/Time   COLORURINE YELLOW 07/07/2017 0239   APPEARANCEUR CLEAR 07/07/2017 0239   LABSPEC 1.011 07/07/2017 0239   PHURINE 6.0 07/07/2017 0239   GLUCOSEU NEGATIVE 07/07/2017 0239   HGBUR NEGATIVE 07/07/2017 0239   BILIRUBINUR NEGATIVE 07/07/2017 0239   KETONESUR NEGATIVE 07/07/2017 0239   PROTEINUR NEGATIVE 07/07/2017 0239   UROBILINOGEN 0.2 05/29/2015 2000   NITRITE NEGATIVE 07/07/2017  0239   LEUKOCYTESUR NEGATIVE 07/07/2017 0239   Sepsis Labs Invalid input(s): PROCALCITONIN,  WBC,  LACTICIDVEN Microbiology No results found for this or any previous visit (from the past 240 hour(s)).   Time coordinating discharge: 45 minutes  SIGNED:   Loretha Stapler, MD  [facility] 07/10/2017, 10:49 AM Pager [phone] If 7PM-7AM, please contact night-coverage [email or web address] Password [email or web address]

## 2017-07-10 DIAGNOSIS — K8500 Idiopathic acute pancreatitis without necrosis or infection: Secondary | ICD-10-CM | POA: Diagnosis not present

## 2017-07-10 LAB — GLUCOSE, CAPILLARY
GLUCOSE-CAPILLARY: 101 mg/dL — AB (ref 65–99)
Glucose-Capillary: 74 mg/dL (ref 65–99)

## 2017-07-10 MED ORDER — OXYCODONE HCL 5 MG PO TABS: 5.0000 mg | ORAL_TABLET | ORAL | 0 refills | Status: DC | PRN

## 2017-07-10 NOTE — Progress Notes (Signed)
Denies nausea and pain.  Tolerating soft diet well and is anxious to go home

## 2017-07-10 NOTE — Progress Notes (Signed)
IV removed and dischare instructions given with medication regimen and scripts.  Follow up appointment scheduled. Mother here to drive patient to car and patient to drive her car home. Continues to deny nausea and pain.

## 2017-08-29 ENCOUNTER — Encounter: Payer: Self-pay | Admitting: Gastroenterology

## 2017-08-29 ENCOUNTER — Ambulatory Visit: Payer: BLUE CROSS/BLUE SHIELD | Admitting: Gastroenterology

## 2017-08-29 ENCOUNTER — Encounter: Payer: Self-pay | Admitting: *Deleted

## 2017-08-29 VITALS — BP 122/68 | HR 93 | Temp 97.2°F | Ht 70.0 in | Wt 210.0 lb

## 2017-08-29 DIAGNOSIS — K861 Other chronic pancreatitis: Secondary | ICD-10-CM

## 2017-08-29 DIAGNOSIS — D649 Anemia, unspecified: Secondary | ICD-10-CM

## 2017-08-29 NOTE — Patient Instructions (Signed)
I've ordered additional blood work to check on etiology of pancreatitis and follow your anemia.  We have ordered an ultrasound to assess for presence of gallstones or any other features that would prompt further testing.  If all of this is unrevealing, we may need to consider an MRI of abdomen that will look again closely at the pancreas and ducts to ensure nothing is causing issues there.   Finally, if this is unrevealing, I would recommend evaluation at a tertiary care facility such as [facility] so that we ensure we have evaluated everything and truly feel this is "idiopathic".   I have given you samples of Zenpep, the pancreatic enzymes. Take 2 with meals (while eating, not before), three times a day. We can send a prescription as well if you do well with this.   I would avoid high fat diets at this point.   Further recommendations to follow!  It was a pleasure to see you today. I strive to create trusting relationships with patients to provide genuine, compassionate, and quality care. I value your feedback. If you receive a survey regarding your visit,  I greatly appreciate you the taking time to fill this out.   Annitta Needs, PhD, ANP-BC [facility]

## 2017-08-29 NOTE — Progress Notes (Signed)
Referring Provider: Iona Beard, MD Primary Care Physician:  Iona Beard, MD Primary GI: Dr. Gala Romney   Chief Complaint  Patient presents with  . Pancreatitis    f/u. Doing better    HPI:   Tara Kane is a 47 y.o. female presenting today with a history of idiopathic pancreatitis. Prior episodes in 2014 and 2016, with lipid panel normal and denying ETOH use. EUS performed noting chronic pancreatitis, unknown etiology. Recent hospitalization in Dec 2018 with abdominal pain but no imaging completed, lipase mildly elevated at 79 . She has declined further testing at previous visits (autoimmune, etc). She had also declined pancreatic enzymes in the past.   She presents today interested in further evaluation. States she had been doing the keto diet prior to this most recent episode of abdominal pain. Doing well since December when hospitalized. No abdominal pain, N/V. No overt GI bleeding.   Past Medical History:  Diagnosis Date  . Anemia   . Diabetes mellitus without complication ([place])    15 yrs Lantus use.  Marland Kitchen Hx of seasonal allergies   . Pancreatitis, acute 09/29/2012, Nov 2016    Past Surgical History:  Procedure Laterality Date  . COLONOSCOPY N/A 08/14/2015   [id number] colon/single colonic polyp (tubular adenoma). Surveillance in 5 years  . ESOPHAGOGASTRODUODENOSCOPY N/A 08/14/2015   DUK:[id number]  . EUS  09/2015   Dr. Ardis Hughs: chronic pancreatitis  . EUS N/A 09/28/2015   Procedure: UPPER ENDOSCOPIC ULTRASOUND (EUS) LINEAR;  Surgeon: Milus Banister, MD;  Location: [facility];  Service: Endoscopy;  Laterality: N/A;  . EYE SURGERY      Current Outpatient Medications  Medication Sig Dispense Refill  . acetaminophen (TYLENOL) 500 MG tablet Take 500 mg by mouth every 6 (six) hours as needed for mild pain or moderate pain.    . cyanocobalamin 1000 MCG tablet Take 1,000 mcg by mouth daily.    . fexofenadine (ALLEGRA) 180 MG tablet Take 180 mg by mouth daily.    . Insulin  Glargine (LANTUS SOLOSTAR) 100 UNIT/ML Solostar Pen Inject 10 Units into the skin at bedtime.     . metFORMIN (GLUCOPHAGE-XR) 500 MG 24 hr tablet Take 1,000 mg by mouth 2 (two) times daily.     . Multiple Vitamin (MULTIVITAMIN) tablet Take 1 tablet by mouth daily. Reported on 07/19/2015     No current facility-administered medications for this visit.     Allergies as of 08/29/2017 - Review Complete 08/29/2017  Allergen Reaction Noted  . Aspirin Anaphylaxis and Swelling 09/28/2012  . Ibuprofen Anaphylaxis and Swelling 09/28/2012  . Latex Swelling 09/29/2012  . Other Swelling 09/29/2012    Family History  Problem Relation Age of Onset  . Diabetes Father   . Polymyalgia rheumatica Mother   . Sarcoidosis Mother   . Colon cancer Neg Hx     Social History   Socioeconomic History  . Marital status: Single    Spouse name: None  . Number of children: None  . Years of education: None  . Highest education level: None  Social Needs  . Financial resource strain: None  . Food insecurity - worry: None  . Food insecurity - inability: None  . Transportation needs - medical: None  . Transportation needs - non-medical: None  Occupational History  . 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 Smoking status: Never Smoker  . Smokeless tobacco: Never Used  Substance and  Sexual Activity  . Alcohol use: No    Alcohol/week: 0.0 oz  . Drug use: No  . Sexual activity: No  Other Topics Concern  . None  Social History Narrative  . None    Review of Systems: Gen: Denies fever, chills, anorexia. Denies fatigue, weakness, weight loss.  CV: Denies chest pain, palpitations, syncope, peripheral edema, and claudication. Resp: Denies dyspnea at rest, cough, wheezing, coughing up blood, and pleurisy. GI:As mentioned in HPI  Derm: Denies rash, itching, dry skin Psych: Denies depression, anxiety, memory loss,  confusion. No homicidal or suicidal ideation.  Heme: Denies bruising, bleeding, and enlarged lymph nodes.  Physical Exam: BP 122/68   Pulse 93   Temp (!) 97.2 F (36.2 C) (Oral)   Ht 5\' 10"  (1.778 m)   Wt 210 lb (95.3 kg)   BMI 30.13 kg/m  General:   Alert and oriented. No distress noted. Pleasant and cooperative.  Head:  Normocephalic and atraumatic. Eyes:  Conjuctiva clear without scleral icterus. Mouth:  Oral mucosa pink and moist.  Abdomen:  +BS, soft, non-tender and non-distended. No rebound or guarding. No HSM or masses noted. Msk:  Symmetrical without gross deformities. Normal posture. Extremities:  Without edema. Neurologic:  Alert and  oriented x4 Psych:  Alert and cooperative. Normal mood and affect.

## 2017-09-01 ENCOUNTER — Ambulatory Visit (HOSPITAL_COMMUNITY)
Admission: RE | Admit: 2017-09-01 | Discharge: 2017-09-01 | Disposition: A | Discharge status: Home / Self Care | Payer: BLUE CROSS/BLUE SHIELD | Source: Ambulatory Visit | Attending: Gastroenterology | Admitting: Gastroenterology

## 2017-09-01 DIAGNOSIS — K861 Other chronic pancreatitis: Secondary | ICD-10-CM | POA: Diagnosis not present

## 2017-09-02 NOTE — Progress Notes (Signed)
Nalleli: I sent this in MyChart, just FYI:  Your hemoglobin is staying in the range it normally is, but your ferritin is dropping some and is low normal. Let's add ferrous sulfate (can get over the counter) 325 milligrams twice a day. This can cause constipation and nausea at times. Let me know if that is the case, and we can pursue a different replacement. Your colonoscopy and upper endoscopy are fairly recent (2017), so I don't feel we need to update those just yet. Lipid profile is good and does not appear to be a culprit to precipitate pancreatitis. Your ultrasound did not show any gallstones, so biliary pancreatitis is unlikely. I'm still waiting on some blood work to result before final recommendations (such as an MRI of pancreas).

## 2017-09-05 NOTE — Assessment & Plan Note (Signed)
47 year old female with history of chronic idiopathic pancreatitis, with prior episodes in 2014 and 2016. EUS on file with chronic pancreatitis, unknown etiology. Recently hospitalized in Dec 2018 with clinical concern for pancreatitis but lipase just mildly elevated. No imaging completed at that time. She had declined further advanced evaluation at last visit; however, she is willing to pursue this now.   Update US abdomen, assessing for gallstones. Doubt this is biliary in nature. Check lipid profile, IgG subclass 4. May need to consider MRCP. Avoid keto diet (high fat diet). Start Zenpep 2 capsules with meals, 1 with snacks. May need evaluation at tertiary facility due to repeated episodes. Thus far, no clear etiology.

## 2017-09-05 NOTE — Assessment & Plan Note (Signed)
Has remained stable. Check iron studies. Colonoscopy/EGD on file from 2017. No concerning GI signs/symptoms.

## 2017-09-06 LAB — IRON,TIBC AND FERRITIN PANEL
%SAT: 13 % (ref 11–50)
Ferritin: 16 ng/mL (ref 10–232)
Iron: 38 ug/dL — ABNORMAL LOW (ref 40–190)
TIBC: 298 mcg/dL (calc) (ref 250–450)

## 2017-09-06 LAB — LIPID PANEL
Cholesterol: 100 mg/dL (ref <200)
HDL: 39 mg/dL — ABNORMAL LOW (ref >50)
LDL CHOLESTEROL (CALC): 48 mg/dL
NON-HDL CHOLESTEROL (CALC): 61 mg/dL (ref <130)
TRIGLYCERIDES: 44 mg/dL (ref <150)
Total CHOL/HDL Ratio: 2.6 (calc) (ref <5.0)

## 2017-09-06 LAB — CBC WITH DIFFERENTIAL/PLATELET
BASOS ABS: 0 {cells}/uL (ref 0–200)
Basophils Relative: 0 %
Eosinophils Absolute: 20 cells/uL (ref 15–500)
Eosinophils Relative: 0.3 %
HEMATOCRIT: 33.5 % — AB (ref 35.0–45.0)
Hemoglobin: 10.8 g/dL — ABNORMAL LOW (ref 11.7–15.5)
LYMPHS ABS: 1380 {cells}/uL (ref 850–3900)
MCH: 26 pg — ABNORMAL LOW (ref 27.0–33.0)
MCHC: 32.2 g/dL (ref 32.0–36.0)
MCV: 80.7 fL (ref 80.0–100.0)
MPV: 12.3 fL (ref 7.5–12.5)
Monocytes Relative: 5.4 %
NEUTROS PCT: 74 %
Neutro Abs: 5032 cells/uL (ref 1500–7800)
PLATELETS: 243 10*3/uL (ref 140–400)
RBC: 4.15 10*6/uL (ref 3.80–5.10)
RDW: 14.8 % (ref 11.0–15.0)
TOTAL LYMPHOCYTE: 20.3 %
WBC: 6.8 10*3/uL (ref 3.8–10.8)
WBCMIX: 367 {cells}/uL (ref 200–950)

## 2017-09-06 LAB — IGG SUBCLASS 4: IgG Subclass 4: 12.6 mg/dL (ref 4.0–86.0)

## 2017-09-08 NOTE — Progress Notes (Signed)
cc'd to pcp 

## 2017-09-16 NOTE — Progress Notes (Signed)
Tara Kane, I sent this to patient in my chart: Your labs have all come back unrevealing (except for the chronic anemia, which is not a new finding). I recommend referral to [facility] to ensure this is truly idiopathic pancreatitis. I don't want to order any unnecessary tests and feel it would be most thorough to be seen by one of the pancreatic specialists. Is this ok with you?

## 2017-09-18 NOTE — Progress Notes (Signed)
Perfectly reasonable. I am sending her a note in [place].

## 2019-06-13 IMAGING — US US ABDOMEN LIMITED
1 series · 14 of 25 positions shown · non-contrast
Comparison: Abdominal CT 05/29/2015

CLINICAL DATA: History of pancreatitis. Evaluate for
cholelithiasis.

EXAM:
ULTRASOUND ABDOMEN LIMITED RIGHT UPPER QUADRANT

[Series 1: us abdomen limited · 0.21mm/px · 14 of 51 slices shown]
[im 1/51]
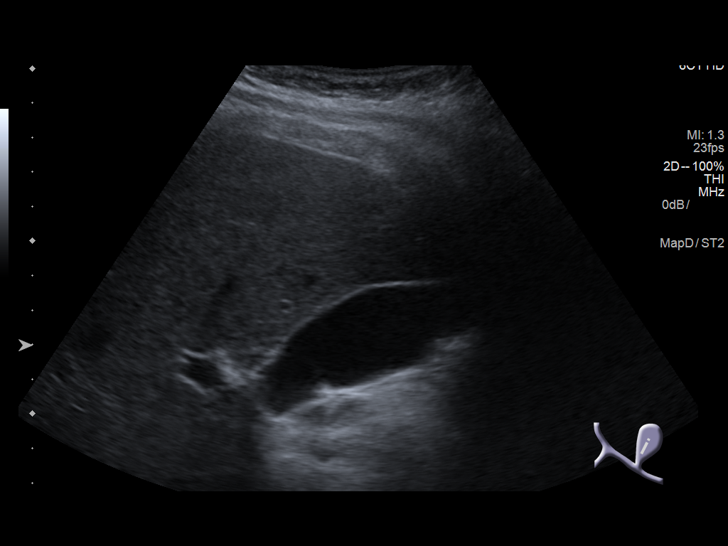
[im 5/51]
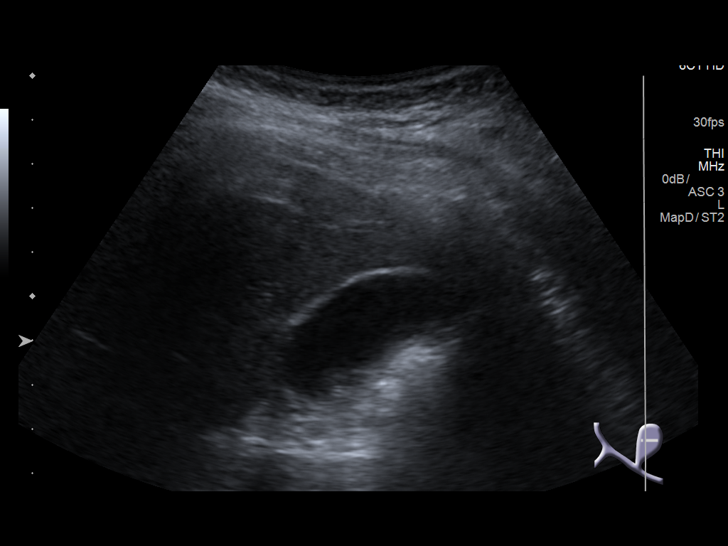
[im 9/51]
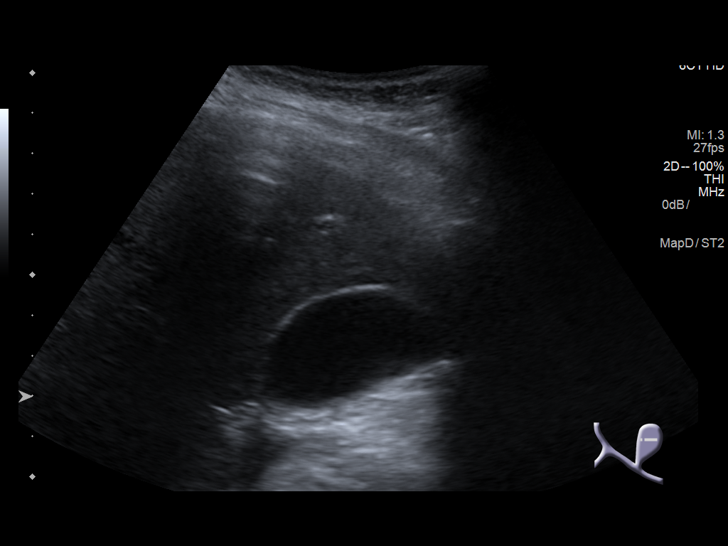
[im 13/51]
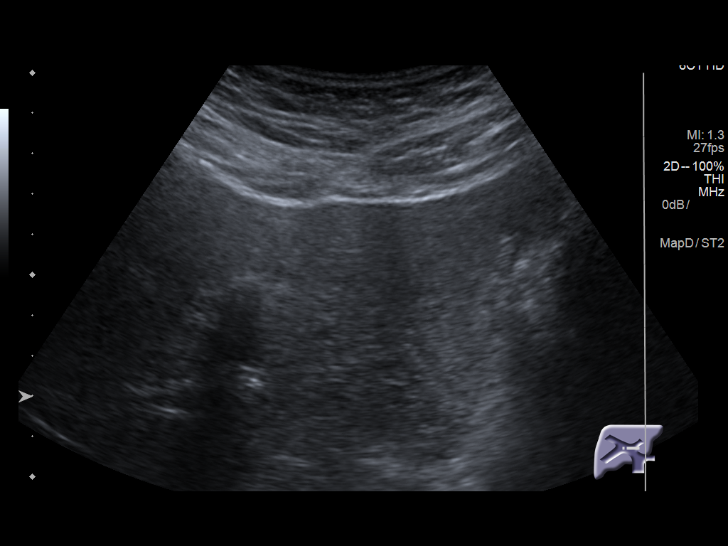
[im 17/51]
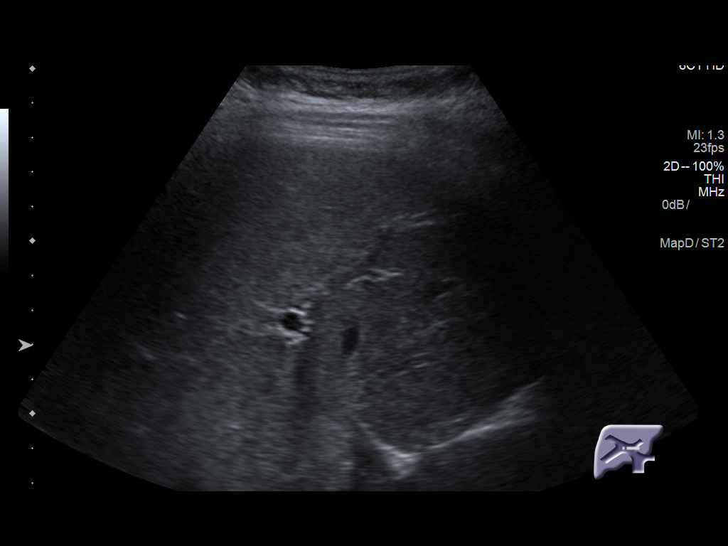
[im 19/51]
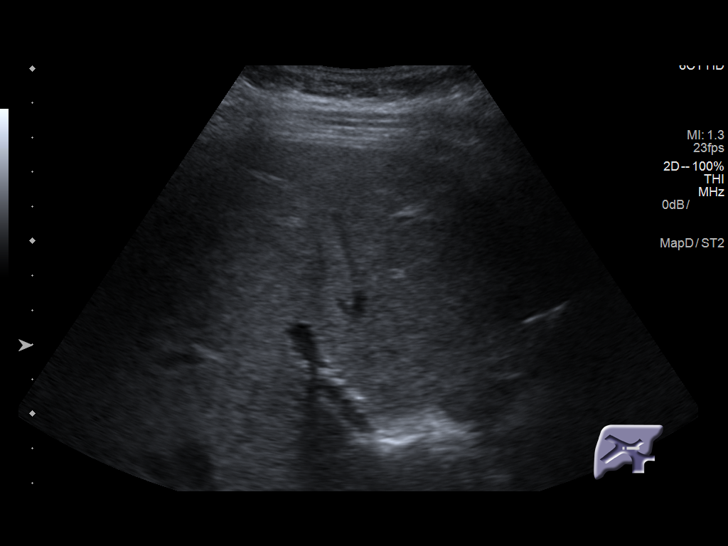
[im 23/51]
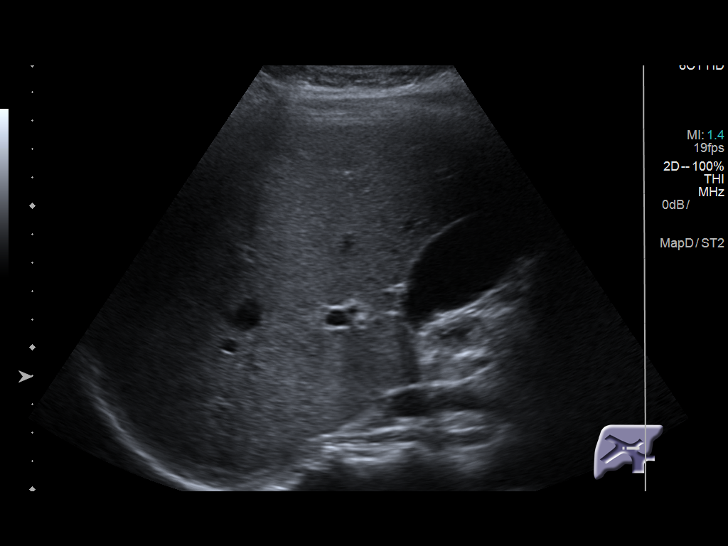
[im 28/51]
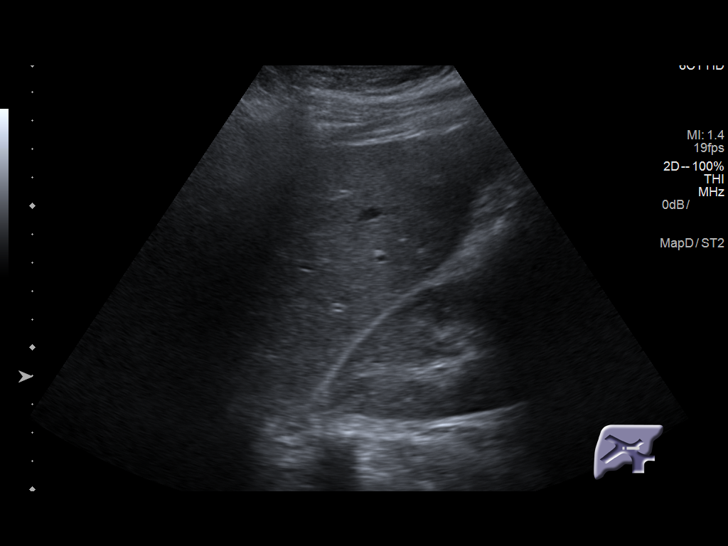
[im 32/51]
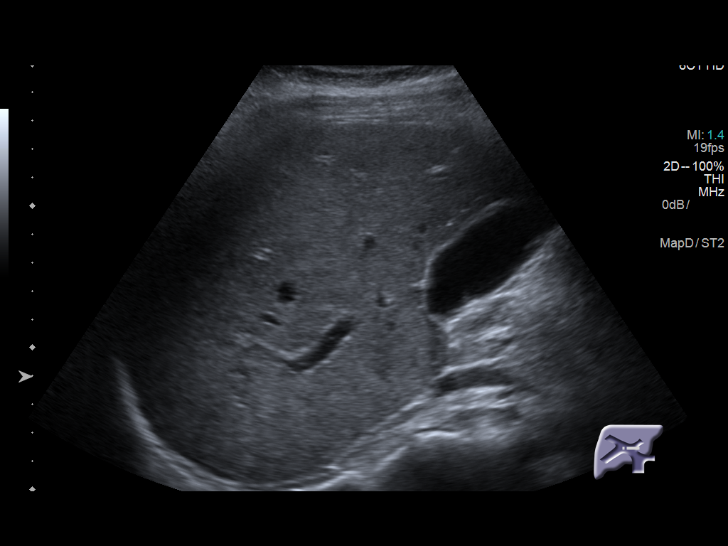
[im 34/51]
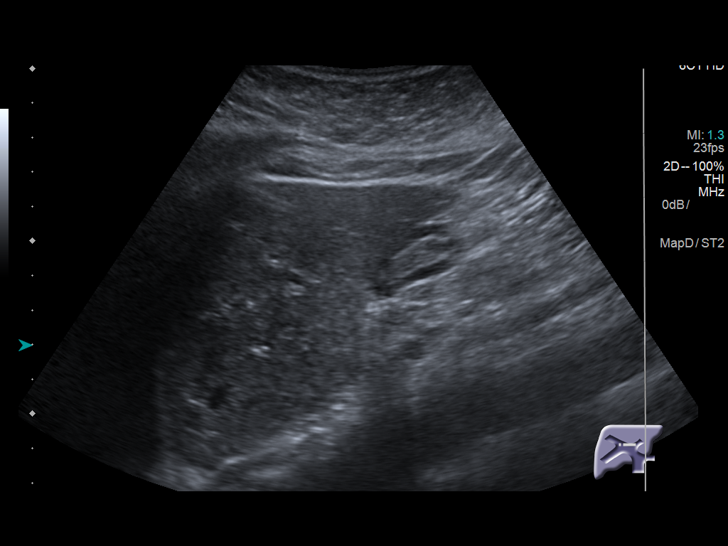
[im 38/51]
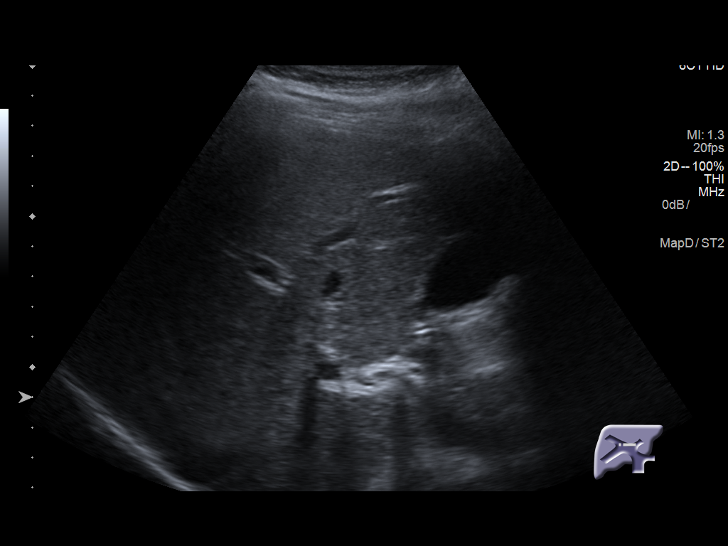
[im 42/51]
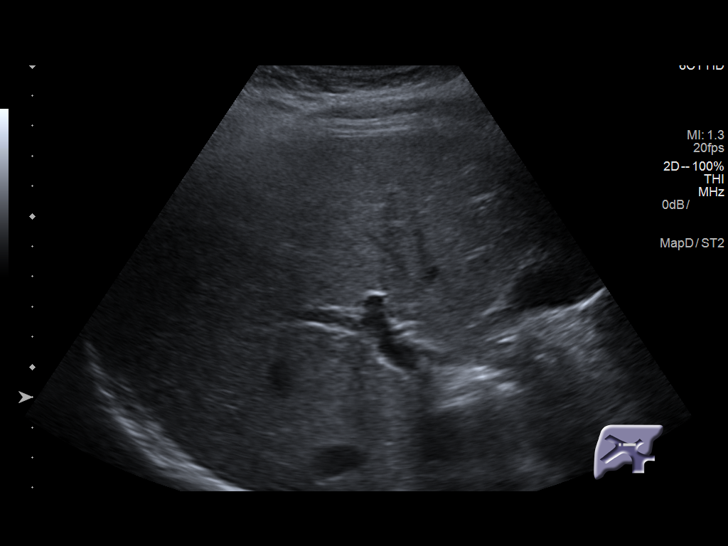
[im 46/51]
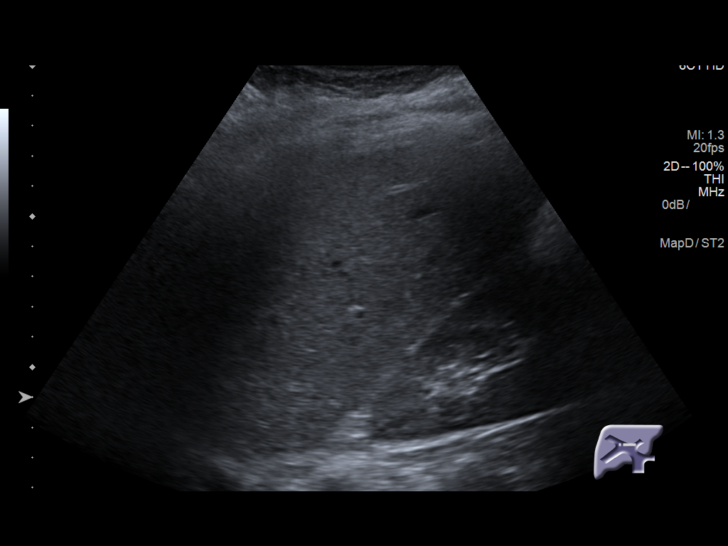
[im 51/51]
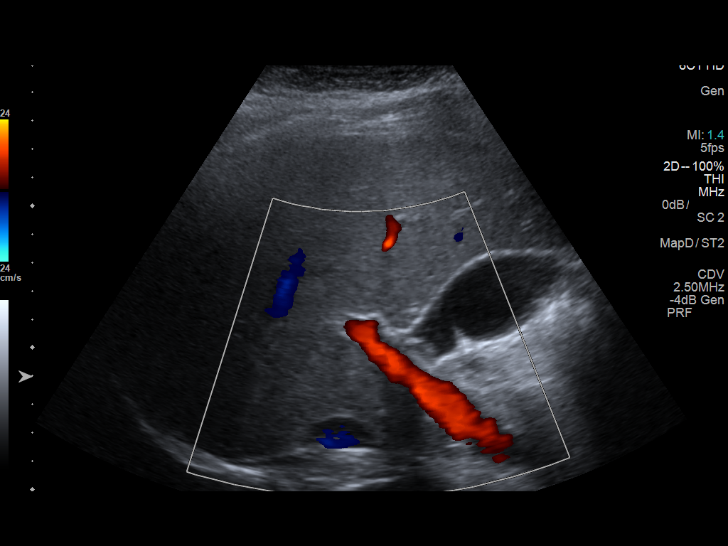

[14 of 25 positions shown; findings below may reference images not displayed]

FINDINGS: Gallbladder:

No gallstones or wall thickening visualized. No sonographic Murphy
sign noted by sonographer.

Common bile duct:

Diameter: 3 mm

Liver:

No focal lesion identified. Within normal limits in parenchymal
echogenicity. Portal vein is patent on color Doppler imaging with
normal direction of blood flow towards the liver.
IMPRESSION: Normal right upper quadrant ultrasound.  No cholelithiasis.

## 2020-07-20 ENCOUNTER — Encounter: Payer: Self-pay | Admitting: Internal Medicine

## 2020-11-02 ENCOUNTER — Ambulatory Visit (INDEPENDENT_AMBULATORY_CARE_PROVIDER_SITE_OTHER): Payer: Self-pay | Admitting: *Deleted

## 2020-11-02 ENCOUNTER — Other Ambulatory Visit: Payer: Self-pay

## 2020-11-02 VITALS — Ht 70.5 in | Wt 234.4 lb

## 2020-11-02 DIAGNOSIS — Z86010 Personal history of colonic polyps: Secondary | ICD-10-CM

## 2020-11-02 MED ORDER — CLENPIQ 10-3.5-12 MG-GM -GM/160ML PO SOLN: 1.0000 | Freq: Once | ORAL | 0 refills | Status: AC

## 2020-11-02 NOTE — Progress Notes (Addendum)
Gastroenterology Pre-Procedure Review  Request Date: 11/02/2020 Requesting Physician: 5 year recall, Last TCS 08/14/2015 done by Dr. Gala Romney, tubular adenoma  PATIENT REVIEW QUESTIONS: The patient responded to the following health history questions as indicated:    1. Diabetes Melitis: yes, type II  2. Joint replacements in the past 12 months: no 3. Major health problems in the past 3 months: no 4. Has an artificial valve or MVP: no 5. Has a defibrillator: no 6. Has been advised in past to take antibiotics in advance of a procedure like teeth cleaning: no 7. Family history of colon cancer: no 8. Alcohol Use: no 9. Illicit drug Use: no 10. History of sleep apnea:  no 11. History of coronary artery or other vascular stents placed within the last 12 months: no 12. History of any prior anesthesia complications: no 13. Body mass index is 33.16 kg/m.    MEDICATIONS & ALLERGIES:    Patient reports the following regarding taking any blood thinners:   Plavix? no Aspirin? no Coumadin? no Brilinta? no Xarelto? no Eliquis? no Pradaxa? no Savaysa? no Effient? no  Patient confirms/reports the following medications:  Current Outpatient Medications  Medication Sig Dispense Refill  . acetaminophen (TYLENOL) 500 MG tablet Take 500 mg by mouth as needed for mild pain or moderate pain.    . cyanocobalamin 1000 MCG tablet Take 1,000 mcg by mouth as needed.    . Digestive Enzymes (LIPASE CONCENTRATE-HP PO) Take by mouth daily.    . fexofenadine (ALLEGRA) 180 MG tablet Take 180 mg by mouth daily.    . insulin glargine (LANTUS) 100 UNIT/ML Solostar Pen Inject 40 Units into the skin at bedtime.    . insulin lispro (HUMALOG) 100 UNIT/ML injection Inject 10 Units into the skin 3 (three) times daily before meals. Sliding Scale.  Pt takes about 10 units with each meal.    . metFORMIN (GLUCOPHAGE-XR) 500 MG 24 hr tablet Take 500 mg by mouth 2 (two) times daily. Takes 500 mg in morning and 1000 mg at night.     . Multiple Vitamin (MULTIVITAMIN) tablet Take 1 tablet by mouth daily. Reported on 07/19/2015     No current facility-administered medications for this visit.    Patient confirms/reports the following allergies:  Allergies  Allergen Reactions  . Aspirin Anaphylaxis and Swelling  . Ibuprofen Anaphylaxis and Swelling  . Latex Swelling    Facial   . Other Swelling    Muscle Relaxers Cause Swelling.     No orders of the defined types were placed in this encounter.   AUTHORIZATION INFORMATION Primary Insurance: [facility],  [place] #:[id number] ,  Group #: [id number] Pre-Cert / Josem Kaufmann required: No, not required  SCHEDULE INFORMATION: Procedure has been scheduled as follows:  Date: 01/24/2021 , Time: 9:30 Location: [facility] with Dr. Gala Romney  This Gastroenterology Pre-Precedure Review Form is being routed to the following provider(s): Roseanne Kaufman, NP

## 2020-11-02 NOTE — Patient Instructions (Addendum)
[place]   Patient Name:  Tara Kane Date of procedure: 01/24/2021 Time to register at [facility]:  8:30 AM Provider:  Dr. Gala Romney  Please notify us immediately if you are diabetic, take iron supplements, or if you are on coumadin or any blood thinners.  Please hold the following medications: See letter.  Note: Do NOT refrigerate or freeze CLENPIQ. CLENPIQ is ready to drink. There is no need to add any other liquid or mix the medicine in the bottle before you start dosing.   01/23/2021- 1 Day prior to procedure:     CLEAR LIQUIDS ALL DAY--NO SOLID FOODS OR DAIRY PRODUCTS! See list of liquids that are allowed and items that are NOT allowed below.   Diabetic Medication Instructions:  See letter.   You must drink plenty of CLEAR LIQUIDS starting before your bowel prep. It is important to stay adequately hydrated before, during, and after your bowel prep for the prep to work effectively!   At 4:00 PM Begin the prep as follows:    1. Drink one bottle of pre-mixed CLENPIQ right from the bottle.  2. Drink at least five (5) 8-ounce drinks of clear liquids of your choice within the next 5 hours   At 10:00 PM: 1. Drink the second bottle of pre-mixed CLENPIQ right from the bottle.   2. Drink at least three (3) 8-ounce drinks of clear liquids of your choice within the next 3 hours before going to bed.    01/24/2021-Day of Procedure:   Diabetic medications adjustments: See letter  Continue clear liquids until 3 hours before your procedure.  Nothing by mouth after 6:30 AM.   You may take TYLENOL products.  Please continue your regular medications unless we have instructed you otherwise.     Please note, on the day of your procedure you MUST be accompanied by an adult who is willing to assume responsibility for you at time of discharge. If you do not have such person with you, your procedure will have to be rescheduled.                                                                                                                      Please leave ALL jewelry at home prior to coming to the hospital for your procedure.   *It is your responsibility to check with your insurance company for the benefits of coverage you have for this procedure. Unfortunately, not all insurance companies have benefits to cover all or part of these types of procedures. It is your responsibility to check your benefits, however we will be glad to assist you with any codes your insurance company may need.   Please note that most insurance companies will not cover a screening colonoscopy for people under the age of 72  For example, with some insurance companies you may have benefits for a screening colonoscopy, but if polyps are found the diagnosis will change and then you may have a deductible that will  need to be met. Please make sure you check your benefits for screening colonoscopy as well as a diagnostic colonoscopy.    CLEAR LIQUIDS: (NO RED or PURPLE) Water  Jello   Apple Juice  White Grape Juice   Kool-Aid Soft drinks  Banana popsicles Sports Drink  Black coffee (No cream or milk) Tea (No cream or milk)  Broth (fat free beef/chicken/vegetable)  Clear liquids allow you to see your fingers on the other side of the glass.  Be sure they are NOT RED or PURPLE in color, cloudy, but CLEAR.  Do Not Eat: Dairy products of any kind Cranberry juice Tomato or V8 Juice  Orange Juice   Grapefruit Juice Red Grape Juice Alcohol   Non-dairy creamer Solid foods like cereal, oatmeal, yogurt, fruits, vegetables, creamed soups, eggs, bread, etc   HELPFUL HINTS TO MAKE DRINKING EASIER: -Trying drinking through a straw. -If you become nauseated, try consuming smaller amounts or stretch out the time between glasses.  Stop for 30 minutes & slowly start back drinking.  Call our office with any questions or concerns at [phone].  Thank You,  Christ Kick, [place]

## 2020-11-06 NOTE — Progress Notes (Signed)
Appropriate. ASA 2. 1/2 dose Lantus evening prior. No metformin day of procedure.

## 2020-11-22 ENCOUNTER — Ambulatory Visit
Admission: EM | Admit: 2020-11-22 | Discharge: 2020-11-22 | Disposition: A | Discharge status: Home / Self Care | Payer: BC Managed Care – PPO | Attending: Emergency Medicine | Admitting: Emergency Medicine

## 2020-11-22 ENCOUNTER — Encounter: Payer: Self-pay | Admitting: Emergency Medicine

## 2020-11-22 ENCOUNTER — Other Ambulatory Visit: Payer: Self-pay

## 2020-11-22 DIAGNOSIS — R0981 Nasal congestion: Secondary | ICD-10-CM

## 2020-11-22 MED ORDER — AMOXICILLIN-POT CLAVULANATE 875-125 MG PO TABS: 1.0000 | ORAL_TABLET | Freq: Two times a day (BID) | ORAL | 0 refills | Status: AC

## 2020-11-22 NOTE — ED Provider Notes (Signed)
Tara Kane   [id number] 11/22/20 Arrival Time: 7829   CC: sinus congestion  SUBJECTIVE: History from: patient.  Tara Kane is a 50 y.o. female who presents with nasal congestion, drainage, mild cough, and scratchy throat x 1 week.  Denies sick exposure to COVID, flu or strep, however, does work at a high school.  Has tried OTC medications without relief.  Denies aggravating factors.  Reports previous symptoms in the past.   Denies fever, chills, SOB, wheezing, chest pain, nausea, changes in bowel or bladder habits.     ROS: As per HPI.  All other pertinent ROS negative.     Past Medical History:  Diagnosis Date  . Anemia   . Diabetes mellitus without complication ([place])    15 yrs Lantus use.  Marland Kitchen Hx of seasonal allergies   . Pancreatitis, acute 09/29/2012, Nov 2016   Past Surgical History:  Procedure Laterality Date  . COLONOSCOPY N/A 08/14/2015   [id number] colon/single colonic polyp (tubular adenoma). Surveillance in 5 years  . ESOPHAGOGASTRODUODENOSCOPY N/A 08/14/2015   [id number]  . EUS  09/2015   Dr. Ardis Hughs: chronic pancreatitis  . EUS N/A 09/28/2015   Procedure: UPPER ENDOSCOPIC ULTRASOUND (EUS) LINEAR;  Surgeon: Milus Banister, MD;  Location: [facility];  Service: Endoscopy;  Laterality: N/A;  . EYE SURGERY     Allergies  Allergen Reactions  . Aspirin Anaphylaxis and Swelling  . Ibuprofen Anaphylaxis and Swelling  . Latex Swelling    Facial   . Other Swelling    Muscle Relaxers Cause Swelling.    No current facility-administered medications on file prior to encounter.   Current Outpatient Medications on File Prior to Encounter  Medication Sig Dispense Refill  . acetaminophen (TYLENOL) 500 MG tablet Take 500 mg by mouth as needed for mild pain or moderate pain.    . cyanocobalamin 1000 MCG tablet Take 1,000 mcg by mouth as needed.    . Digestive Enzymes (LIPASE CONCENTRATE-HP PO) Take by mouth daily.    . fexofenadine (ALLEGRA) 180 MG tablet  Take 180 mg by mouth daily.    . insulin glargine (LANTUS) 100 UNIT/ML Solostar Pen Inject 40 Units into the skin at bedtime.    . insulin lispro (HUMALOG) 100 UNIT/ML injection Inject 10 Units into the skin 3 (three) times daily before meals. Sliding Scale.  Pt takes about 10 units with each meal.    . metFORMIN (GLUCOPHAGE-XR) 500 MG 24 hr tablet Take 500 mg by mouth 2 (two) times daily. Takes 500 mg in morning and 1000 mg at night.    . Multiple Vitamin (MULTIVITAMIN) tablet Take 1 tablet by mouth daily. Reported on 07/19/2015     Social History   Socioeconomic History  . Marital status: Single    Spouse name: Not on file  . Number of children: Not on file  . Years of education: Not on file  . Highest education level: Not on file  Occupational History  . 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 Smoking status: Never Smoker  . Smokeless tobacco: Never Used  Substance and Sexual Activity  . Alcohol use: No    Alcohol/week: 0.0 standard drinks  . Drug use: No  . Sexual activity: Never  Other Topics Concern  . Not on file  Social History Narrative  . Not on file   Social Determinants of Health   Financial Resource Strain: Not on file  Food Insecurity: Not on file  Transportation Needs: Not on file  Physical Activity: Not on file  Stress: Not on file  Social Connections: Not on file  Intimate Partner Violence: Not on file   Family History  Problem Relation Age of Onset  . Diabetes Father   . Polymyalgia rheumatica Mother   . Sarcoidosis Mother   . Colon cancer Neg Hx     OBJECTIVE:  Vitals:   11/22/20 0833  BP: 125/71  Pulse: 95  Resp: 18  Temp: 98.4 F (36.9 C)  TempSrc: Oral  SpO2: 96%     General appearance: alert; appears fatigued, but nontoxic; speaking in full sentences and tolerating own secretions HEENT: NCAT; Ears: EACs clear, TMs pearly gray; Eyes: PERRL.  EOM  grossly intact. Sinuses: nontender; Nose: nares patent without rhinorrhea, Throat: oropharynx clear, tonsils non erythematous or enlarged, uvula midline  Neck: supple without LAD Lungs: unlabored respirations, symmetrical air entry; cough: mild; no respiratory distress; CTAB Heart: regular rate and rhythm.   Skin: warm and dry Psychological: alert and cooperative; normal mood and affect   ASSESSMENT & PLAN:  1. Sinus congestion   2. Nasal congestion     Meds ordered this encounter  Medications  . amoxicillin-clavulanate (AUGMENTIN) 875-125 MG tablet    Sig: Take 1 tablet by mouth every 12 (twelve) hours for 10 days.    Dispense:  20 tablet    Refill:  0    Order Specific Question:   Supervising Provider    Answer:   Raylene Everts [[id number]]   Get plenty of rest and push fluids Augmentin for sinus infection Use OTC zyrtec for nasal congestion, runny nose, and/or sore throat Use OTC flonase for nasal congestion and runny nose Use medications daily for symptom relief Use OTC medications like ibuprofen or tylenol as needed fever or pain Call or go to the ED if you have any new or worsening symptoms such as fever, cough, shortness of breath, chest tightness, chest pain, turning blue, changes in mental status, etc...   Reviewed expectations re: course of current medical issues. Questions answered. Outlined signs and symptoms indicating need for more acute intervention. Patient verbalized understanding. After Visit Summary given.         Lestine Box, PA-C 11/22/20 [phone]

## 2020-11-22 NOTE — Discharge Instructions (Signed)
Get plenty of rest and push fluids Augmentin for sinus infection Use OTC zyrtec for nasal congestion, runny nose, and/or sore throat Use OTC flonase for nasal congestion and runny nose Use medications daily for symptom relief Use OTC medications like ibuprofen or tylenol as needed fever or pain Call or go to the ED if you have any new or worsening symptoms such as fever, cough, shortness of breath, chest tightness, chest pain, turning blue, changes in mental status, etc..Marland Kitchen

## 2020-11-22 NOTE — ED Triage Notes (Signed)
Nasal congestion , drainage and scratchy throat x 1 week.  Taking sudafed with no relief.

## 2020-11-24 NOTE — Progress Notes (Signed)
Pt requested July procedure.

## 2020-12-25 ENCOUNTER — Encounter: Payer: Self-pay | Admitting: *Deleted

## 2020-12-25 NOTE — Progress Notes (Signed)
Mailed letter to pt with diabetes medication adjustments.   

## 2020-12-25 NOTE — Progress Notes (Signed)
Pt denies any changes in medications or health since previous triage.

## 2021-01-23 ENCOUNTER — Telehealth: Payer: Self-pay | Admitting: Internal Medicine

## 2021-01-23 NOTE — Telephone Encounter (Signed)
Noted  

## 2021-01-23 NOTE — Telephone Encounter (Signed)
Tammy from Short Stay called to let Tretha Sciara know that they couldn't reach her via phone and if we reached her she needs to arrive tomorrow at 0730 if not then 0830.

## 2021-01-24 ENCOUNTER — Other Ambulatory Visit: Payer: Self-pay

## 2021-01-24 ENCOUNTER — Ambulatory Visit (HOSPITAL_COMMUNITY)
Admission: RE | Admit: 2021-01-24 | Discharge: 2021-01-24 | Disposition: A | Discharge status: Home / Self Care | Payer: BC Managed Care – PPO | Attending: Internal Medicine | Admitting: Internal Medicine

## 2021-01-24 ENCOUNTER — Encounter (HOSPITAL_COMMUNITY): Payer: Self-pay | Admitting: Internal Medicine

## 2021-01-24 ENCOUNTER — Encounter (HOSPITAL_COMMUNITY)
Admission: RE | Disposition: A | Discharge status: Home / Self Care | Payer: Self-pay | Source: Home / Self Care | Attending: Internal Medicine

## 2021-01-24 DIAGNOSIS — Z86010 Personal history of colonic polyps: Secondary | ICD-10-CM | POA: Insufficient documentation

## 2021-01-24 DIAGNOSIS — Z1211 Encounter for screening for malignant neoplasm of colon: Secondary | ICD-10-CM | POA: Insufficient documentation

## 2021-01-24 DIAGNOSIS — Z794 Long term (current) use of insulin: Secondary | ICD-10-CM | POA: Insufficient documentation

## 2021-01-24 DIAGNOSIS — Z7984 Long term (current) use of oral hypoglycemic drugs: Secondary | ICD-10-CM | POA: Insufficient documentation

## 2021-01-24 DIAGNOSIS — Z91040 Latex allergy status: Secondary | ICD-10-CM | POA: Insufficient documentation

## 2021-01-24 DIAGNOSIS — Z886 Allergy status to analgesic agent status: Secondary | ICD-10-CM | POA: Diagnosis not present

## 2021-01-24 DIAGNOSIS — Z79899 Other long term (current) drug therapy: Secondary | ICD-10-CM | POA: Insufficient documentation

## 2021-01-24 HISTORY — PX: COLONOSCOPY: SHX5424

## 2021-01-24 LAB — GLUCOSE, CAPILLARY: Glucose-Capillary: 138 mg/dL — ABNORMAL HIGH (ref 70–99)

## 2021-01-24 SURGERY — COLONOSCOPY
Anesthesia: Moderate Sedation

## 2021-01-24 MED ORDER — MEPERIDINE HCL 50 MG/ML IJ SOLN
INTRAMUSCULAR | Status: DC
Start: 2021-01-24 — End: 2021-01-24
  Filled 2021-01-24: qty 1

## 2021-01-24 MED ORDER — MEPERIDINE HCL 100 MG/ML IJ SOLN
INTRAMUSCULAR | Status: DC | PRN
  Administered 2021-01-24: 40 mg via INTRAVENOUS
  Administered 2021-01-24: 10 mg via INTRAVENOUS

## 2021-01-24 MED ORDER — ONDANSETRON HCL 4 MG/2ML IJ SOLN
INTRAMUSCULAR | Status: AC
  Filled 2021-01-24: qty 2

## 2021-01-24 MED ORDER — MIDAZOLAM HCL 5 MG/5ML IJ SOLN
INTRAMUSCULAR | Status: AC
  Filled 2021-01-24: qty 10

## 2021-01-24 MED ORDER — MIDAZOLAM HCL 5 MG/5ML IJ SOLN
INTRAMUSCULAR | Status: DC | PRN
  Administered 2021-01-24: 1 mg via INTRAVENOUS
  Administered 2021-01-24 (×2): 2 mg via INTRAVENOUS

## 2021-01-24 MED ORDER — STERILE WATER FOR IRRIGATION IR SOLN
Status: DC | PRN
  Administered 2021-01-24: 1.5 mL

## 2021-01-24 MED ORDER — ONDANSETRON HCL 4 MG/2ML IJ SOLN
INTRAMUSCULAR | Status: DC | PRN
  Administered 2021-01-24: 4 mg via INTRAVENOUS

## 2021-01-24 MED ORDER — SODIUM CHLORIDE 0.9 % IV SOLN: INTRAVENOUS | Status: DC

## 2021-01-24 NOTE — Op Note (Signed)
[facility] Patient Name: Tara Kane Procedure Date: 01/24/2021 7:15 AM MRN: [id number] Date of Birth: 1970-10-27 Attending MD: Norvel Richards , MD CSN: [id number] Age: 50 Admit Type: Outpatient Procedure:                Colonoscopy Indications:              High risk colon cancer surveillance: Personal                            history of colonic polyps Providers:                Norvel Richards, MD, Gwenlyn Fudge, RN, Randa Spike, Technician Referring MD:              Medicines:                Meperidine 50 mg IV, Midazolam 5 mg IV Complications:            No immediate complications. Estimated Blood Loss:     Estimated blood loss: none. Procedure:                Pre-Anesthesia Assessment:                           - Prior to the procedure, a History and Physical                            was performed, and patient medications and                            allergies were reviewed. The patient's tolerance of                            previous anesthesia was also reviewed. The risks                            and benefits of the procedure and the sedation                            options and risks were discussed with the patient.                            All questions were answered, and informed consent                            was obtained. Prior Anticoagulants: The patient has                            taken no previous anticoagulant or antiplatelet                            agents. ASA Grade Assessment: II - A patient with  mild systemic disease. After reviewing the risks                            and benefits, the patient was deemed in                            satisfactory condition to undergo the procedure.                           After obtaining informed consent, the colonoscope                            was passed under direct vision. Throughout the                             procedure, the patient's blood pressure, pulse, and                            oxygen saturations were monitored continuously. The                            CF-HQ190L ([id number]) scope was introduced through                            the anus and advanced to the the cecum, identified                            by appendiceal orifice and ileocecal valve. The                            colonoscopy was performed without difficulty. The                            patient tolerated the procedure well. The quality                            of the bowel preparation was adequate. Scope In: 9:39:14 AM Scope Out: 9:55:22 AM Scope Withdrawal Time: 0 hours 8 minutes 54 seconds  Total Procedure Duration: 0 hours 16 minutes 8 seconds  Findings:      The perianal and digital rectal examinations were normal.      The colon (entire examined portion) appeared normal.      The retroflexed view of the distal rectum and anal verge was normal and       showed no anal or rectal abnormalities aside from a single anal papilla. Impression:               - The entire examined colon is normal.                           - The distal rectum and anal verge are normal on                            retroflexion view.                           -  No specimens collected. Moderate Sedation:      Moderate (conscious) sedation was administered by the endoscopy nurse       and supervised by the endoscopist. The following parameters were       monitored: oxygen saturation, heart rate, blood pressure, respiratory       rate, EKG, adequacy of pulmonary ventilation, and response to care.       Total physician intraservice time was 21 minutes. Recommendation:           - Patient has a contact number available for                            emergencies. The signs and symptoms of potential                            delayed complications were discussed with the                            patient. Return to normal activities  tomorrow.                            Written discharge instructions were provided to the                            patient.                           - Advance diet as tolerated.                           - Continue present medications.                           - Repeat colonoscopy in 7 years for surveillance.                           - Return to GI office (date not yet determined). Procedure Code(s):        --- Professional ---                           [phone], Colonoscopy, flexible; diagnostic, including                            collection of specimen(s) by brushing or washing,                            when performed (separate procedure)                           G0500, Moderate sedation services provided by the                            same physician or other qualified health care                            professional performing a gastrointestinal  endoscopic service that sedation supports,                            requiring the presence of an independent trained                            observer to assist in the monitoring of the                            patient's level of consciousness and physiological                            status; initial 15 minutes of intra-service time;                            patient age 37 years or older (additional time may                            be reported with [phone], as appropriate) Diagnosis Code(s):        --- Professional ---                           Z86.010, Personal history of colonic polyps CPT copyright 2019 [employer] Medical Association. All rights reserved. The codes documented in this report are preliminary and upon coder review may  be revised to meet current compliance requirements. Cristopher Estimable. Catlynn Grondahl, MD Norvel Richards, MD 01/24/2021 10:07:28 AM This report has been signed electronically. Number of Addenda: 0

## 2021-01-24 NOTE — Discharge Instructions (Signed)
  Colonoscopy Discharge Instructions  Read the instructions outlined below and refer to this sheet in the next few weeks. These discharge instructions provide you with general information on caring for yourself after you leave the hospital. Your doctor may also give you specific instructions. While your treatment has been planned according to the most current medical practices available, unavoidable complications occasionally occur. If you have any problems or questions after discharge, call Dr. Gala Romney at [phone]. ACTIVITY You may resume your regular activity, but move at a slower pace for the next 24 hours.  Take frequent rest periods for the next 24 hours.  Walking will help get rid of the air and reduce the bloated feeling in your belly (abdomen).  No driving for 24 hours (because of the medicine (anesthesia) used during the test).   Do not sign any important legal documents or operate any machinery for 24 hours (because of the anesthesia used during the test).  NUTRITION Drink plenty of fluids.  You may resume your normal diet as instructed by your doctor.  Begin with a light meal and progress to your normal diet. Heavy or fried foods are harder to digest and may make you feel sick to your stomach (nauseated).  Avoid alcoholic beverages for 24 hours or as instructed.  MEDICATIONS You may resume your normal medications unless your doctor tells you otherwise.  WHAT YOU CAN EXPECT TODAY Some feelings of bloating in the abdomen.  Passage of more gas than usual.  Spotting of blood in your stool or on the toilet paper.  IF YOU HAD POLYPS REMOVED DURING THE COLONOSCOPY: No aspirin products for 7 days or as instructed.  No alcohol for 7 days or as instructed.  Eat a soft diet for the next 24 hours.  FINDING OUT THE RESULTS OF YOUR TEST Not all test results are available during your visit. If your test results are not back during the visit, make an appointment with your caregiver to find out the  results. Do not assume everything is normal if you have not heard from your caregiver or the medical facility. It is important for you to follow up on all of your test results.  SEEK IMMEDIATE MEDICAL ATTENTION IF: You have more than a spotting of blood in your stool.  Your belly is swollen (abdominal distention).  You are nauseated or vomiting.  You have a temperature over 101.  You have abdominal pain or discomfort that is severe or gets worse throughout the day.    No polyps found today  It is recommended you return in 7 years for repeat colonoscopy  At patient request, I called Rinaldo Cloud and [phone] -reviewed findings and recommendations

## 2021-01-24 NOTE — H&P (Signed)
@LOGO @   Primary Care Physician:  Monico Blitz, MD Primary Gastroenterologist:  Dr. Gala Romney  Pre-Procedure History & Physical: HPI:  Tara Kane is a 50 y.o. female here for surveillance colonoscopy.  History of adenoma removed recall in 5 years ago.  No bowel symptoms.  History of chronic pancreatitis.  No symptoms with pancreatic enzyme supplements.  Past Medical History:  Diagnosis Date   Anemia    Diabetes mellitus without complication ([place])    15 yrs Lantus use.   Hx of seasonal allergies    Pancreatitis, acute 09/29/2012, Nov 2016    Past Surgical History:  Procedure Laterality Date   COLONOSCOPY N/A 08/14/2015   [id number] colon/single colonic polyp (tubular adenoma). Surveillance in 5 years   ESOPHAGOGASTRODUODENOSCOPY N/A 08/14/2015   [id number]   EUS  09/2015   Dr. Ardis Hughs: chronic pancreatitis   EUS N/A 09/28/2015   Procedure: UPPER ENDOSCOPIC ULTRASOUND (EUS) LINEAR;  Surgeon: Milus Banister, MD;  Location: [facility] END[facility];  Service: Endoscopy;  Laterality: N/A;   EYE SURGERY      Prior to Admission medications   Medication Sig Start Date End Date Taking? Authorizing Provider  acetaminophen (TYLENOL) 500 MG tablet Take 500 mg by mouth every 6 (six) hours as needed for mild pain or moderate pain.   Yes [provider]  Digestive Enzymes (LIPASE CONCENTRATE-HP PO) Take 1 capsule by mouth daily.   Yes [provider]  fexofenadine (ALLEGRA) 180 MG tablet Take 180 mg by mouth daily.   Yes [provider]  Insulin Glargine (BASAGLAR KWIKPEN) 100 UNIT/ML Inject 40-42 Units into the skin at bedtime.   Yes [provider]  insulin lispro (HUMALOG) 100 UNIT/ML injection Inject 5-8 Units into the skin 3 (three) times daily with meals. Sliding Scale.   Yes [provider]  metFORMIN (GLUCOPHAGE-XR) 500 MG 24 hr tablet Take 500-1,000 mg by mouth See admin instructions. Takes 500 mg in morning and 1000 mg at night. 04/05/15  Yes  [provider]  Multiple Vitamin (MULTIVITAMIN) tablet Take 1 tablet by mouth daily.   Yes [provider]    Allergies as of 12/25/2020 - Review Complete 11/22/2020  Allergen Reaction Noted   Aspirin Anaphylaxis and Swelling 09/28/2012   Ibuprofen Anaphylaxis and Swelling 09/28/2012   Latex Swelling 09/29/2012   Other Swelling 09/29/2012    Family History  Problem Relation Age of Onset   Diabetes Father    Polymyalgia rheumatica Mother    Sarcoidosis Mother    Colon cancer Neg Hx     Social History   Socioeconomic History   Marital status: Single    Spouse name: Not on file   Number of children: Not on file   Years of education: Not on file   Highest education level: Not on file  Occupational History   Occupation: Environmental consultant principal    Employer: [employer]    Comment: Angelina [employer], [employer]  Tobacco Use   Smoking status: Never   Smokeless tobacco: Never  Substance and Sexual Activity   Alcohol use: No    Alcohol/week: 0.0 standard drinks   Drug use: No   Sexual activity: Never  Other Topics Concern   Not on file  Social History Narrative   Not on file   Social Determinants of Health   Financial Resource Strain: Not on file  Food Insecurity: Not on file  Transportation Needs: Not on file  Physical Activity: Not on file  Stress: Not on file  Social Connections:  Not on file  Intimate Partner Violence: Not on file    Review of Systems: See HPI, otherwise negative ROS  Physical Exam: BP 119/62   Temp 98.1 F (36.7 C) (Oral)   Resp 14   Ht 5\' 10"  (1.778 m)   Wt 104.3 kg   SpO2 96%   BMI 33.00 kg/m  General:   Alert,  Well-developed, well-nourished, pleasant and cooperative in NAD Neck:  Supple; no masses or thyromegaly. No significant cervical adenopathy. Lungs:  Clear throughout to auscultation.   No wheezes, crackles, or rhonchi. No acute distress. Heart:  Regular rate and rhythm; no murmurs, clicks, rubs,  or  gallops. Abdomen: Non-distended, normal bowel sounds.  Soft and nontender without appreciable mass or hepatosplenomegaly.  Pulses:  Normal pulses noted. Extremities:  Without clubbing or edema.  Impression/Plan: 50 year old lady with a history colonic adenoma here for surveillance colonoscopy. The risks, benefits, limitations, alternatives and imponderables have been reviewed with the patient. Questions have been answered. All parties are agreeable.       Notice: This dictation was prepared with Dragon dictation along with smaller phrase technology. Any transcriptional errors that result from this process are unintentional and may not be corrected upon review.

## 2021-01-31 ENCOUNTER — Encounter (HOSPITAL_COMMUNITY): Payer: Self-pay | Admitting: Internal Medicine

## 2021-02-15 ENCOUNTER — Encounter (INDEPENDENT_AMBULATORY_CARE_PROVIDER_SITE_OTHER): Payer: Self-pay

## 2021-03-21 ENCOUNTER — Encounter (INDEPENDENT_AMBULATORY_CARE_PROVIDER_SITE_OTHER): Payer: Self-pay

## 2021-12-28 ENCOUNTER — Ambulatory Visit: Payer: BC Managed Care – PPO | Admitting: Podiatry

## 2021-12-28 ENCOUNTER — Ambulatory Visit (INDEPENDENT_AMBULATORY_CARE_PROVIDER_SITE_OTHER): Payer: BC Managed Care – PPO

## 2021-12-28 DIAGNOSIS — M722 Plantar fascial fibromatosis: Secondary | ICD-10-CM

## 2022-01-01 ENCOUNTER — Encounter: Payer: Self-pay | Admitting: Podiatry

## 2022-01-01 NOTE — Progress Notes (Signed)
Subjective:  Patient ID: Tara Kane, female    DOB: 11/03/1970,  MRN: [id number]  No chief complaint on file.   51 y.o. female presents with the above complaint.  Patient presents with complaint left heel pain that has been going for 2 months has progressive gotten worse.  It is sharp pain.  Hurts with ambulation.  She wanted to get it evaluated.  Hurts with taking for step in the morning.  She has not tried anything for it.  She is a diabetic.  She denies any other acute complaints.   Review of Systems: Negative except as noted in the HPI. Denies N/V/F/Ch.  Past Medical History:  Diagnosis Date   Anemia    Diabetes mellitus without complication ([place])    15 yrs Lantus use.   Hx of seasonal allergies    Pancreatitis, acute 09/29/2012, Nov 2016    Current Outpatient Medications:    acetaminophen (TYLENOL) 500 MG tablet, Take 500 mg by mouth every 6 (six) hours as needed for mild pain or moderate pain., Disp: , Rfl:    Digestive Enzymes (LIPASE CONCENTRATE-HP PO), Take 1 capsule by mouth daily., Disp: , Rfl:    fexofenadine (ALLEGRA) 180 MG tablet, Take 180 mg by mouth daily., Disp: , Rfl:    Insulin Glargine (BASAGLAR KWIKPEN) 100 UNIT/ML, Inject 40-42 Units into the skin at bedtime., Disp: , Rfl:    insulin lispro (HUMALOG) 100 UNIT/ML injection, Inject 5-8 Units into the skin 3 (three) times daily with meals. Sliding Scale., Disp: , Rfl:    metFORMIN (GLUCOPHAGE-XR) 500 MG 24 hr tablet, Take 500-1,000 mg by mouth See admin instructions. Takes 500 mg in morning and 1000 mg at night., Disp: , Rfl:    Multiple Vitamin (MULTIVITAMIN) tablet, Take 1 tablet by mouth daily., Disp: , Rfl:   Social History   Tobacco Use  Smoking Status Never  Smokeless Tobacco Never    Allergies  Allergen Reactions   Nsaids Anaphylaxis and Swelling   Latex Swelling    Facial    Other Swelling    Muscle Relaxers Cause Swelling.    Penicillins Itching and Swelling   Objective:  There were  no vitals filed for this visit. There is no height or weight on file to calculate BMI. Constitutional Well developed. Well nourished.  Vascular Dorsalis pedis pulses palpable bilaterally. Posterior tibial pulses palpable bilaterally. Capillary refill normal to all digits.  No cyanosis or clubbing noted. Pedal hair growth normal.  Neurologic Normal speech. Oriented to person, place, and time. Epicritic sensation to light touch grossly present bilaterally.  Dermatologic Nails well groomed and normal in appearance. No open wounds. No skin lesions.  Orthopedic: Normal joint ROM without pain or crepitus bilaterally. No visible deformities. Tender to palpation at the calcaneal tuber left. No pain with calcaneal squeeze left. Ankle ROM diminished range of motion left. Silfverskiold Test: Positive left   Radiographs: Taken and reviewed. No acute fractures or dislocations. No evidence of stress fracture.  Plantar heel spur absent. Posterior heel spur present.  Pes planovalgus foot structure noted  Assessment:   1. Plantar fasciitis of left foot    Plan:  Patient was evaluated and treated and all questions answered.  Plantar Fasciitis, left - XR reviewed as above.  - Educated on icing and stretching. Instructions given.  - Injection delivered to the plantar fascia as below. - DME: Plantar fascial brace dispensed to support the medial longitudinal arch of the foot and offload pressure from the heel and prevent  arch collapse during weightbearing - Pharmacologic management: None  Procedure: Injection Tendon/Ligament Location: Left plantar fascia at the glabrous junction; medial approach. Skin Prep: alcohol Injectate: 0.5 cc 0.5% marcaine plain, 0.5 cc of 1% Lidocaine, 0.5 cc kenalog 10. Disposition: Patient tolerated procedure well. Injection site dressed with a band-aid.  No follow-ups on file.

## 2022-02-01 ENCOUNTER — Ambulatory Visit: Payer: BC Managed Care – PPO | Admitting: Podiatry
